# Patient Record
Sex: Male | Born: 1960 | Race: White | Hispanic: No | Marital: Single | Smoking: Never smoker
Health system: Southern US, Community
[De-identification: ages and names within clinical notes are randomized; demographics above are authoritative.]

## PROBLEM LIST (undated history)

## (undated) DIAGNOSIS — K5792 Diverticulitis of intestine, part unspecified, without perforation or abscess without bleeding: Secondary | ICD-10-CM

## (undated) DIAGNOSIS — Z789 Other specified health status: Secondary | ICD-10-CM

## (undated) DIAGNOSIS — I10 Essential (primary) hypertension: Secondary | ICD-10-CM

## (undated) HISTORY — DX: Diverticulitis of intestine, part unspecified, without perforation or abscess without bleeding: K57.92

## (undated) HISTORY — PX: NO PAST SURGERIES: SHX2092

---

## 2015-07-14 ENCOUNTER — Emergency Department: Payer: Self-pay

## 2015-07-14 ENCOUNTER — Inpatient Hospital Stay
Admission: EM | Admit: 2015-07-14 | Discharge: 2015-07-18 | DRG: 392 | Disposition: A | Discharge status: Home / Self Care | Payer: Self-pay | Attending: General Surgery | Admitting: General Surgery

## 2015-07-14 ENCOUNTER — Encounter: Payer: Self-pay | Admitting: Emergency Medicine

## 2015-07-14 DIAGNOSIS — K5792 Diverticulitis of intestine, part unspecified, without perforation or abscess without bleeding: Secondary | ICD-10-CM | POA: Diagnosis present

## 2015-07-14 DIAGNOSIS — K5720 Diverticulitis of large intestine with perforation and abscess without bleeding: Principal | ICD-10-CM | POA: Diagnosis present

## 2015-07-14 DIAGNOSIS — K5732 Diverticulitis of large intestine without perforation or abscess without bleeding: Secondary | ICD-10-CM | POA: Insufficient documentation

## 2015-07-14 DIAGNOSIS — K631 Perforation of intestine (nontraumatic): Secondary | ICD-10-CM

## 2015-07-14 DIAGNOSIS — Z79899 Other long term (current) drug therapy: Secondary | ICD-10-CM

## 2015-07-14 LAB — URINALYSIS COMPLETE WITH MICROSCOPIC (ARMC ONLY)
BILIRUBIN URINE: NEGATIVE
Bacteria, UA: NONE SEEN
GLUCOSE, UA: NEGATIVE mg/dL
Ketones, ur: NEGATIVE mg/dL
Nitrite: NEGATIVE
Protein, ur: 30 mg/dL — AB
SQUAMOUS EPITHELIAL / LPF: NONE SEEN
Specific Gravity, Urine: 1.025 (ref 1.005–1.030)
pH: 5 (ref 5.0–8.0)

## 2015-07-14 LAB — COMPREHENSIVE METABOLIC PANEL
ALK PHOS: 61 U/L (ref 38–126)
ALT: 39 U/L (ref 17–63)
AST: 22 U/L (ref 15–41)
Albumin: 3.7 g/dL (ref 3.5–5.0)
Anion gap: 7 (ref 5–15)
BUN: 15 mg/dL (ref 6–20)
CALCIUM: 9.2 mg/dL (ref 8.9–10.3)
CHLORIDE: 103 mmol/L (ref 101–111)
CO2: 28 mmol/L (ref 22–32)
CREATININE: 0.91 mg/dL (ref 0.61–1.24)
Glucose, Bld: 106 mg/dL — ABNORMAL HIGH (ref 65–99)
Potassium: 4.1 mmol/L (ref 3.5–5.1)
SODIUM: 138 mmol/L (ref 135–145)
Total Bilirubin: 0.9 mg/dL (ref 0.3–1.2)
Total Protein: 7.3 g/dL (ref 6.5–8.1)

## 2015-07-14 LAB — CBC
HCT: 44 % (ref 40.0–52.0)
Hemoglobin: 15.3 g/dL (ref 13.0–18.0)
MCH: 32.9 pg (ref 26.0–34.0)
MCHC: 34.9 g/dL (ref 32.0–36.0)
MCV: 94.2 fL (ref 80.0–100.0)
PLATELETS: 329 10*3/uL (ref 150–440)
RBC: 4.67 MIL/uL (ref 4.40–5.90)
RDW: 12.6 % (ref 11.5–14.5)
WBC: 16.1 10*3/uL — ABNORMAL HIGH (ref 3.8–10.6)

## 2015-07-14 LAB — LIPASE, BLOOD: LIPASE: 33 U/L (ref 22–51)

## 2015-07-14 MED ORDER — MORPHINE SULFATE 1 MG/ML IV SOLN
INTRAVENOUS | Status: DC
  Administered 2015-07-14: 23:00:00 via INTRAVENOUS
  Administered 2015-07-15: 3.8 mg via INTRAVENOUS
  Administered 2015-07-15 (×2): 3 mg via INTRAVENOUS
  Administered 2015-07-15: 1.5 mg via INTRAVENOUS
  Administered 2015-07-15: 19:00:00 via INTRAVENOUS
  Administered 2015-07-16: 1.5 mg via INTRAVENOUS
  Administered 2015-07-16: 0 mg via INTRAVENOUS
  Administered 2015-07-16 – 2015-07-17 (×4): 1.5 mg via INTRAVENOUS
  Filled 2015-07-14 (×2): qty 25

## 2015-07-14 MED ORDER — METRONIDAZOLE IN NACL 5-0.79 MG/ML-% IV SOLN
500.0000 mg | Freq: Once | INTRAVENOUS | Status: DC
  Administered 2015-07-14: 500 mg via INTRAVENOUS
  Filled 2015-07-14 (×2): qty 100

## 2015-07-14 MED ORDER — PIPERACILLIN-TAZOBACTAM 3.375 G IVPB
3.3750 g | Freq: Three times a day (TID) | INTRAVENOUS | Status: DC
  Administered 2015-07-14 – 2015-07-18 (×11): 3.375 g via INTRAVENOUS
  Filled 2015-07-14 (×14): qty 50

## 2015-07-14 MED ORDER — MORPHINE SULFATE (PF) 4 MG/ML IV SOLN
4.0000 mg | INTRAVENOUS | Status: DC | PRN
  Administered 2015-07-14 – 2015-07-17 (×2): 4 mg via INTRAVENOUS
  Filled 2015-07-14 (×2): qty 1

## 2015-07-14 MED ORDER — ENOXAPARIN SODIUM 40 MG/0.4ML ~~LOC~~ SOLN
40.0000 mg | SUBCUTANEOUS | Status: DC
  Administered 2015-07-14 – 2015-07-17 (×4): 40 mg via SUBCUTANEOUS
  Filled 2015-07-14 (×4): qty 0.4

## 2015-07-14 MED ORDER — IOHEXOL 240 MG/ML SOLN
25.0000 mL | Freq: Once | INTRAMUSCULAR | Status: AC | PRN
  Administered 2015-07-14: 25 mL via ORAL

## 2015-07-14 MED ORDER — IOHEXOL 300 MG/ML  SOLN
100.0000 mL | Freq: Once | INTRAMUSCULAR | Status: AC | PRN
  Administered 2015-07-14: 100 mL via INTRAVENOUS
  Filled 2015-07-14: qty 100

## 2015-07-14 MED ORDER — LACTATED RINGERS IV SOLN
INTRAVENOUS | Status: DC
  Administered 2015-07-14 – 2015-07-18 (×11): via INTRAVENOUS

## 2015-07-14 MED ORDER — DIPHENHYDRAMINE HCL 50 MG/ML IJ SOLN: 12.5000 mg | Freq: Four times a day (QID) | INTRAMUSCULAR | Status: DC | PRN

## 2015-07-14 MED ORDER — SODIUM CHLORIDE 0.9 % IV BOLUS (SEPSIS)
1000.0000 mL | Freq: Once | INTRAVENOUS | Status: AC
  Administered 2015-07-14: 1000 mL via INTRAVENOUS

## 2015-07-14 MED ORDER — LORAZEPAM 1 MG PO TABS: 1.0000 mg | ORAL_TABLET | Freq: Four times a day (QID) | ORAL | Status: AC | PRN

## 2015-07-14 MED ORDER — HYDROMORPHONE HCL 1 MG/ML IJ SOLN
0.5000 mg | INTRAMUSCULAR | Status: DC | PRN
  Administered 2015-07-14: 0.5 mg via INTRAVENOUS
  Filled 2015-07-14: qty 1

## 2015-07-14 MED ORDER — LORAZEPAM 2 MG/ML IJ SOLN: 1.0000 mg | Freq: Four times a day (QID) | INTRAMUSCULAR | Status: AC | PRN

## 2015-07-14 MED ORDER — FOLIC ACID 1 MG PO TABS
1.0000 mg | ORAL_TABLET | Freq: Every day | ORAL | Status: DC
  Administered 2015-07-14 – 2015-07-18 (×5): 1 mg via ORAL
  Filled 2015-07-14 (×5): qty 1

## 2015-07-14 MED ORDER — NALOXONE HCL 0.4 MG/ML IJ SOLN: 0.4000 mg | INTRAMUSCULAR | Status: DC | PRN

## 2015-07-14 MED ORDER — METOCLOPRAMIDE HCL 5 MG/ML IJ SOLN
5.0000 mg | Freq: Once | INTRAMUSCULAR | Status: AC
  Administered 2015-07-14: 5 mg via INTRAVENOUS
  Filled 2015-07-14: qty 2

## 2015-07-14 MED ORDER — ADULT MULTIVITAMIN W/MINERALS CH
1.0000 | ORAL_TABLET | Freq: Every day | ORAL | Status: DC
  Administered 2015-07-14 – 2015-07-18 (×5): 1 via ORAL
  Filled 2015-07-14 (×5): qty 1

## 2015-07-14 MED ORDER — CIPROFLOXACIN IN D5W 400 MG/200ML IV SOLN
400.0000 mg | Freq: Once | INTRAVENOUS | Status: AC
  Administered 2015-07-14: 400 mg via INTRAVENOUS
  Filled 2015-07-14 (×2): qty 200

## 2015-07-14 MED ORDER — THIAMINE HCL 100 MG/ML IJ SOLN
100.0000 mg | Freq: Every day | INTRAMUSCULAR | Status: DC
  Administered 2015-07-14: 100 mg via INTRAVENOUS
  Filled 2015-07-14: qty 2

## 2015-07-14 MED ORDER — ONDANSETRON 4 MG PO TBDP: 4.0000 mg | ORAL_TABLET | Freq: Four times a day (QID) | ORAL | Status: DC | PRN

## 2015-07-14 MED ORDER — ONDANSETRON HCL 4 MG/2ML IJ SOLN
4.0000 mg | Freq: Four times a day (QID) | INTRAMUSCULAR | Status: DC | PRN
  Administered 2015-07-15: 4 mg via INTRAVENOUS
  Filled 2015-07-14: qty 2

## 2015-07-14 MED ORDER — SODIUM CHLORIDE 0.9 % IJ SOLN: 9.0000 mL | INTRAMUSCULAR | Status: DC | PRN

## 2015-07-14 MED ORDER — DIPHENHYDRAMINE HCL 50 MG/ML IJ SOLN: 25.0000 mg | Freq: Four times a day (QID) | INTRAMUSCULAR | Status: DC | PRN

## 2015-07-14 MED ORDER — DIPHENHYDRAMINE HCL 12.5 MG/5ML PO ELIX: 12.5000 mg | ORAL_SOLUTION | Freq: Four times a day (QID) | ORAL | Status: DC | PRN

## 2015-07-14 MED ORDER — VITAMIN B-1 100 MG PO TABS
100.0000 mg | ORAL_TABLET | Freq: Every day | ORAL | Status: DC
  Administered 2015-07-15 – 2015-07-18 (×4): 100 mg via ORAL
  Filled 2015-07-14 (×5): qty 1

## 2015-07-14 MED ORDER — ONDANSETRON HCL 4 MG/2ML IJ SOLN: 4.0000 mg | Freq: Four times a day (QID) | INTRAMUSCULAR | Status: DC | PRN

## 2015-07-14 MED ORDER — HYDRALAZINE HCL 20 MG/ML IJ SOLN: 10.0000 mg | INTRAMUSCULAR | Status: DC | PRN

## 2015-07-14 MED ORDER — DIPHENHYDRAMINE HCL 25 MG PO CAPS: 25.0000 mg | ORAL_CAPSULE | Freq: Four times a day (QID) | ORAL | Status: DC | PRN

## 2015-07-14 NOTE — ED Notes (Signed)
Pt to ed with c/o abd pain and n/v/d since Thursday.  Sent from Dr. Vear Clock office for EVAL.

## 2015-07-14 NOTE — ED Notes (Signed)
Pt states that it burns when he urinates, denies seeing blood in urine. Pt reports hesitancy. Denies urgency or frequency.

## 2015-07-14 NOTE — ED Provider Notes (Signed)
[facility] Emergency Department Provider Note  ____________________________________________  Time seen: 1710  I have reviewed the triage vital signs and the nursing notes.   HISTORY  Chief Complaint Abdominal Pain     HPI Karl Richardson is a 54 y.o. male who began to have abdominal pain this past Thursday. He went up lying in bed feeling ill, feeling cold, shivering. He did not take his temperature. He started to have diarrhea on Saturday. He denies any nausea or vomiting.  He denies any previous similar symptoms.  His pain has continued. He reports primarily in his lower abdomen, equal on both sides. It does hurt more when he urinates as well as when he defecates.  He denies any blood per rectum.  He went to see his primary physician, Dr. Vear Clock, earlier today and was sent to the emergency department for further evaluation.       History reviewed. No pertinent past medical history.  There are no active problems to display for this patient.   History reviewed. No pertinent past surgical history.  No current outpatient prescriptions on file.  Allergies Review of patient's allergies indicates no known allergies.  History reviewed. No pertinent family history.  Social History Social History  Substance Use Topics  . Smoking status: Never Smoker   . Smokeless tobacco: None  . Alcohol Use: Yes    Review of Systems  Constitutional: Negative for fever. ENT: Negative for sore throat. Cardiovascular: Negative for chest pain. Respiratory: Negative for shortness of breath. Gastrointestinal: Positive for abdominal pain with diarrhea. No nausea or vomiting. Genitourinary: Negative for dysuria. Musculoskeletal: No myalgias or injuries. Skin: Negative for rash. Neurological: Negative for headaches   10-point ROS otherwise negative.  ____________________________________________   PHYSICAL EXAM:  VITAL SIGNS: ED Triage Vitals  Enc Vitals  Group     BP 07/14/15 1457 150/82 mmHg     Pulse Rate 07/14/15 1457 83     Resp 07/14/15 1457 20     Temp 07/14/15 1457 98.2 F (36.8 C)     Temp Source 07/14/15 1457 Oral     SpO2 07/14/15 1457 100 %     Weight 07/14/15 1457 145 lb (65.772 kg)     Height 07/14/15 1457  (1.651 m)     Head Cir --      Peak Flow --      Pain Score 07/14/15 1458 6     Pain Loc --      Pain Edu? --      Excl. in GC? --     Constitutional:  Alert and oriented. Looks slightly uncomfortable but in no distress. ENT   Head: Normocephalic and atraumatic.   Nose: No congestion/rhinnorhea. Cardiovascular: Normal rate, regular rhythm, no murmur noted Respiratory:  Normal respiratory effort, no tachypnea.    Breath sounds are clear and equal bilaterally.  Gastrointestinal: Soft. Notable tenderness diffusely, but appears to be worse in the lower abdomen, equal bilaterally. No distention.  Back: No muscle spasm, no tenderness, no CVA tenderness. Musculoskeletal: No deformity noted. Nontender with normal range of motion in all extremities.  No noted edema. Neurologic:  Normal speech and language. No gross focal neurologic deficits are appreciated.  Skin:  Skin is warm, dry. No rash noted. Psychiatric: Mood and affect are normal. Speech and behavior are normal.  ____________________________________________    LABS (pertinent positives/negatives)  Labs Reviewed  COMPREHENSIVE METABOLIC PANEL - Abnormal; Notable for the following:    Glucose, Bld 106 (*)  All other components within normal limits  CBC - Abnormal; Notable for the following:    WBC 16.1 (*)    All other components within normal limits  URINALYSIS COMPLETEWITH MICROSCOPIC ([facility] ONLY) - Abnormal; Notable for the following:    Color, Urine AMBER (*)    APPearance CLEAR (*)    Hgb urine dipstick 1+ (*)    Protein, ur 30 (*)    Leukocytes, UA TRACE (*)    All other components within normal limits  LIPASE, BLOOD    RADIOLOGY  CT abdomen:  Sigmoid diverticulitis with evidence of perforation without abscess. The patient has some free air outside the lumen.  ____________________________________________   INITIAL IMPRESSION / ASSESSMENT AND PLAN / ED COURSE  Pertinent labs & imaging results that were available during my care of the patient were reviewed by me and considered in my medical decision making (see chart for details).  54 year old male with atypical lower abdominal pain, febrile symptoms, and diarrhea. He could have cellulitis, diverticulitis, or some other infectious process. I discussed getting a CT or not with the patient. He could be treated empirically with antibiotics. The patient and I agree to obtaining the CT for further information and clarification.  ----------------------------------------- 6:47 PM on 07/14/2015 -----------------------------------------  I proceeded focal from radiology with the interpretation from CT. The patient has sigmoid diverticulitis with a perforation without abscess. I have called surgery and spoke with Dr. Juliann Pulse who will pass it onto his colleague to evaluate the patient in the emergency department.  ____________________________________________   FINAL CLINICAL IMPRESSION(S) / ED DIAGNOSES  Final diagnoses:  Sigmoid diverticulitis  Perforation of sigmoid colon (HCC)       Darien Ramus, MD 07/14/15 [phone]

## 2015-07-14 NOTE — H&P (Signed)
Patient ID: Karl Richardson, male   DOB: 1961/09/10, 54 y.o.   MRN: [id number] CC: ABD PAIN HPI Karl Richardson is a 54 y.o. male who presents to the emergency department for evaluation of a four-day history of lower abdominal pain. Patient states that the pain started just below the umbilicus and moved to primarily his left lower quadrant over the last 4 days. Starting today the pain also started rate over to the right lower quadrant. He's had subjective fever and chills however none since Friday night. He states that the pain was actually at its worst Friday night and gradually improved over the weekend. He came in today because the pain moved to a new area and had not completely resolved and he thought he should be checked out. He states the pain is worsened when he attempts to void either urine or stool. He has had some nausea however been able to eat throughout this. He's never had anything like this. Prior to Thursday he was in his usual state of good health. He states his last bowel movement was earlier today but was running. He has been able to void in the emergency department for the laboratory evaluation. He denies any current fevers, chills, shortness breath, chest pain or nausea.  HPI  History reviewed. No pertinent past medical history. takes no medications  History reviewed. No pertinent past surgical history. has never had surgery  History reviewed. No pertinent family history. patient and mother deny any significant family medical history.  Social History Social History  Substance Use Topics  . Smoking status: Never Smoker   . Smokeless tobacco: None  . 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 He should also states that he smokes 2-3 joints of marijuana every day.  No Known Allergies  Current Facility-Administered Medications  Medication Dose Route Frequency Provider Last Rate Last Dose  . ciprofloxacin (CIPRO) IVPB 400 mg  400 mg Intravenous Once Darien Ramus, MD 200 mL/hr at 07/14/15 1926 400 mg at 07/14/15 1926  . HYDROmorphone (DILAUDID) injection 0.5 mg  0.5 mg Intravenous Q15 min PRN Darien Ramus, MD   0.5 mg at 07/14/15 1903  . metroNIDAZOLE (FLAGYL) IVPB 500 mg  500 mg Intravenous Once Darien Ramus, MD       No current outpatient prescriptions on file.     Review of Systems A multipoint review of systems was completed. All pertinent positives negatives within the history of present illness.  Physical Exam Blood pressure 150/82, pulse 83, temperature 98.2 F (36.8 C), temperature source Oral, resp. rate 20, height  (1.651 m), weight 65.772 kg (145 lb), SpO2 100 %. CONSTITUTIONAL: Resting in bed in no acute distress. EYES: Pupils are equal, round, and reactive to light, Sclera are non-icteric. EARS, NOSE, MOUTH AND THROAT: The oropharynx is clear. The oral mucosa is pink and moist. Hearing is intact to voice. LYMPH NODES:  Lymph nodes in the neck are normal. RESPIRATORY:  Lungs are clear. There is normal respiratory effort, with equal breath sounds bilaterally, and without pathologic use of accessory muscles. CARDIOVASCULAR: Heart is regular without murmurs, gallops, or rubs. GI: The abdomen is soft, tender to palpation in the left and right lower quadrant. The left lower quadrant is the worst. Nondistended. There are no palpable masses. There is no hepatosplenomegaly. There are normal bowel sounds in all quadrants. GU: Rectal deferred.   MUSCULOSKELETAL: Normal muscle strength and tone. No cyanosis or edema.   SKIN: Turgor  is good and there are no pathologic skin lesions or ulcers. NEUROLOGIC: Motor and sensation is grossly normal. Cranial nerves are grossly intact. PSYCH:  Oriented to person, place and time. Affect is normal.  Data Reviewed Labs and images reviewed by me. Labs are significant for a leukocytosis of 16,000. His imaging shows severe inflammation around the sigmoid colon with thickening of the sigmoid  colon. This appears to be ruptured diverticulitis however might represent a perforated colon cancer. There is no obvious abscess or large amounts of free intraperitoneal air. I have personally reviewed the patient's imaging, laboratory findings and medical records.    Assessment    54 year old male with ruptured diverticulitis.    Plan    Discussed with the patient and his mother the treatment for ruptured diverticulitis to include nothing by mouth, IV fluids, IV antibiotics. Discussed once his pain has resolved to within start him on oral antibiotics with a plan for colonoscopy in 6 weeks. Also discussed that should his pain not improve that he may require a repeat CT scan in a few days followed by likely surgical intervention. Patient and his mother voiced understanding of this plan. Discussed with the patient that although he has never withdrawn for alcohol he admits to having never stops drinking before. We'll place him on alcohol withdrawal protocol to get him through the next few days. Once he is able to resume a diet we will quickly add alcohol back to it.     Time spent with the patient was 30 minutes, with more than 50% of the time spent in face-to-face education, counseling and care coordination.     [name] 07/14/2015, 7:41 PM

## 2015-07-15 DIAGNOSIS — K5720 Diverticulitis of large intestine with perforation and abscess without bleeding: Principal | ICD-10-CM

## 2015-07-15 LAB — CBC
HEMATOCRIT: 39 % — AB (ref 40.0–52.0)
HEMOGLOBIN: 13.4 g/dL (ref 13.0–18.0)
MCH: 32.7 pg (ref 26.0–34.0)
MCHC: 34.4 g/dL (ref 32.0–36.0)
MCV: 95.1 fL (ref 80.0–100.0)
Platelets: 285 10*3/uL (ref 150–440)
RBC: 4.1 MIL/uL — ABNORMAL LOW (ref 4.40–5.90)
RDW: 12.3 % (ref 11.5–14.5)
WBC: 16.7 10*3/uL — AB (ref 3.8–10.6)

## 2015-07-15 LAB — PHOSPHORUS: PHOSPHORUS: 3.3 mg/dL (ref 2.5–4.6)

## 2015-07-15 LAB — BASIC METABOLIC PANEL
ANION GAP: 8 (ref 5–15)
BUN: 13 mg/dL (ref 6–20)
CALCIUM: 8.5 mg/dL — AB (ref 8.9–10.3)
CHLORIDE: 103 mmol/L (ref 101–111)
CO2: 26 mmol/L (ref 22–32)
Creatinine, Ser: 0.79 mg/dL (ref 0.61–1.24)
GFR calc non Af Amer: >60 mL/min (ref >60)
Glucose, Bld: 111 mg/dL — ABNORMAL HIGH (ref 65–99)
Potassium: 3.8 mmol/L (ref 3.5–5.1)
Sodium: 137 mmol/L (ref 135–145)

## 2015-07-15 LAB — MAGNESIUM: Magnesium: 1.8 mg/dL (ref 1.7–2.4)

## 2015-07-15 NOTE — Progress Notes (Signed)
Spoke with Dr. Juliann Pulse to see if patient may have ice chips.  Order received.

## 2015-07-15 NOTE — Progress Notes (Signed)
Surgery Progress Note  S: Mild improvement in pain O:Blood pressure 133/74, pulse 87, temperature 99.5 F (37.5 C), temperature source Oral, resp. rate 16, height  (1.651 m), weight 145 lb (65.772 kg), SpO2 96 %. GEN: NAD/A&Ox3 ABD: soft, mild tender suprapubic, nondistended  WBC 16.7  A/P 54 yo admit with diverticulitis - NPO - IV abx

## 2015-07-15 NOTE — Progress Notes (Signed)
Spoke with Dr. Juliann Pulse to clarify NPO order as patient has PO medications ordered.  Changed to NPO except meds.

## 2015-07-16 LAB — BASIC METABOLIC PANEL
ANION GAP: 10 (ref 5–15)
BUN: 10 mg/dL (ref 6–20)
CALCIUM: 8.5 mg/dL — AB (ref 8.9–10.3)
CHLORIDE: 97 mmol/L — AB (ref 101–111)
CO2: 26 mmol/L (ref 22–32)
Creatinine, Ser: 0.74 mg/dL (ref 0.61–1.24)
GFR calc non Af Amer: >60 mL/min (ref >60)
Glucose, Bld: 103 mg/dL — ABNORMAL HIGH (ref 65–99)
POTASSIUM: 3.9 mmol/L (ref 3.5–5.1)
Sodium: 133 mmol/L — ABNORMAL LOW (ref 135–145)

## 2015-07-16 LAB — CBC
HEMATOCRIT: 39.7 % — AB (ref 40.0–52.0)
HEMOGLOBIN: 13.5 g/dL (ref 13.0–18.0)
MCH: 31.9 pg (ref 26.0–34.0)
MCHC: 34.1 g/dL (ref 32.0–36.0)
MCV: 93.5 fL (ref 80.0–100.0)
Platelets: 312 10*3/uL (ref 150–440)
RBC: 4.24 MIL/uL — AB (ref 4.40–5.90)
RDW: 12.1 % (ref 11.5–14.5)
WBC: 21.9 10*3/uL — AB (ref 3.8–10.6)

## 2015-07-16 LAB — MAGNESIUM: Magnesium: 1.7 mg/dL (ref 1.7–2.4)

## 2015-07-16 LAB — PHOSPHORUS: PHOSPHORUS: 3.4 mg/dL (ref 2.5–4.6)

## 2015-07-16 NOTE — Progress Notes (Signed)
Surgery Progress Note  S: Feels better, min pain. Hungry O:Blood pressure 135/74, pulse 86, temperature 100 F (37.8 C), temperature source Oral, resp. rate 15, height  (1.651 m), weight 145 lb (65.772 kg), SpO2 99 %. GEN: NAD/A&Ox3 ABD: soft, min tender, nondistended  WBC 21  A/P 54 yo admit with diverticulitis, improving - IV abx - liquids

## 2015-07-17 LAB — CBC
HEMATOCRIT: 40.5 % (ref 40.0–52.0)
HEMOGLOBIN: 13.7 g/dL (ref 13.0–18.0)
MCH: 31.8 pg (ref 26.0–34.0)
MCHC: 33.9 g/dL (ref 32.0–36.0)
MCV: 93.8 fL (ref 80.0–100.0)
Platelets: 338 10*3/uL (ref 150–440)
RBC: 4.32 MIL/uL — ABNORMAL LOW (ref 4.40–5.90)
RDW: 12.2 % (ref 11.5–14.5)
WBC: 19.4 10*3/uL — AB (ref 3.8–10.6)

## 2015-07-17 LAB — BASIC METABOLIC PANEL
ANION GAP: 8 (ref 5–15)
BUN: 8 mg/dL (ref 6–20)
CALCIUM: 8.6 mg/dL — AB (ref 8.9–10.3)
CHLORIDE: 100 mmol/L — AB (ref 101–111)
CO2: 28 mmol/L (ref 22–32)
Creatinine, Ser: 0.82 mg/dL (ref 0.61–1.24)
GFR calc non Af Amer: >60 mL/min (ref >60)
GLUCOSE: 115 mg/dL — AB (ref 65–99)
Potassium: 4 mmol/L (ref 3.5–5.1)
Sodium: 136 mmol/L (ref 135–145)

## 2015-07-17 LAB — PHOSPHORUS: PHOSPHORUS: 3.1 mg/dL (ref 2.5–4.6)

## 2015-07-17 LAB — MAGNESIUM: Magnesium: 2 mg/dL (ref 1.7–2.4)

## 2015-07-17 NOTE — Progress Notes (Signed)
Surgery Progress Note  S: Feels better, hungry O:Blood pressure 133/74, pulse 76, temperature 98.3 F (36.8 C), temperature source Oral, resp. rate 17, height  (1.651 m), weight 145 lb (65.772 kg), SpO2 97 %. GEN: NAD/A&Ox3 ABD: soft, min tender, nondistended  A/P 54 yo admit with diverticulitis, improving - regular diet - d/c pca - possibly transition to PO abx later today

## 2015-07-18 MED ORDER — CIPROFLOXACIN HCL 500 MG PO TABS
500.0000 mg | ORAL_TABLET | Freq: Two times a day (BID) | ORAL | Status: DC
  Administered 2015-07-18: 500 mg via ORAL
  Filled 2015-07-18: qty 1

## 2015-07-18 MED ORDER — METRONIDAZOLE 500 MG PO TABS
500.0000 mg | ORAL_TABLET | Freq: Three times a day (TID) | ORAL | Status: DC
  Administered 2015-07-18: 500 mg via ORAL
  Filled 2015-07-18: qty 1

## 2015-07-18 MED ORDER — CIPROFLOXACIN HCL 500 MG PO TABS: 500.0000 mg | ORAL_TABLET | Freq: Two times a day (BID) | ORAL | Status: DC

## 2015-07-18 MED ORDER — METRONIDAZOLE 500 MG PO TABS: 500.0000 mg | ORAL_TABLET | Freq: Three times a day (TID) | ORAL | Status: DC

## 2015-07-18 NOTE — Progress Notes (Signed)
Surgery Progress Note  S: Min pain (soreness), tolerating regular diet O: Blood pressure 137/71, pulse 67, temperature 98.5 F (36.9 C), temperature source Oral, resp. rate 16, height  (1.651 m), weight 145 lb (65.772 kg), SpO2 100 %. GEN: NAD/A&Ox3 ABD: soft, min tender, nondistended  A/P 54 yo admit with diverticulitis, clinically improved - regular diet - transition to PO abx

## 2015-07-18 NOTE — Progress Notes (Signed)
Dr. Juliann Pulse rounded on pt. Dr. Juliann Pulse gave orders to primary nurse to continue with discharge. Pt was instructed to contact Dr. Juliann Pulse office or come back to the ER if tingling increases.

## 2015-07-18 NOTE — Discharge Instructions (Signed)
Diverticulitis  Diverticulitis is when small pockets that have formed in your colon (large intestine) become infected or swollen.  HOME CARE  · Follow your doctor's instructions.  · Follow a special diet if told by your doctor.  · When you feel better, your doctor may tell you to change your diet. You may be told to eat a lot of fiber. Fruits and vegetables are good sources of fiber. Fiber makes it easier to poop (have bowel movements).  · Take supplements or probiotics as told by your doctor.  · Only take medicines as told by your doctor.  · Keep all follow-up visits with your doctor.  GET HELP IF:  · Your pain does not get better.  · You have a hard time eating food.  · You are not pooping like normal.  GET HELP RIGHT AWAY IF:  · Your pain gets worse.  · Your problems do not get better.  · Your problems suddenly get worse.  · You have a fever.  · You keep throwing up (vomiting).  · You have bloody or black, tarry poop (stool).  MAKE SURE YOU:   · Understand these instructions.  · Will watch your condition.  · Will get help right away if you are not doing well or get worse.     This information is not intended to replace advice given to you by your health care provider. Make sure you discuss any questions you have with your health care provider.     Document Released: 03/15/2008 Document Revised: 10/02/2013 Document Reviewed: 08/22/2013  Elsevier Interactive Patient Education ©2016 [employer] Inc[employer]

## 2015-07-18 NOTE — Progress Notes (Signed)
Pt received discharge orders. Instructions were reviewed with pt. IV removed with dressing dry and intact. Pt complained several minutes later of tingling in left hand. VSS taken and stable. Hand Grips Strong. Dr. Juliann Pulse notified and verbalized that he would come see pt. Primary nurse to continue to monitor pt.

## 2015-07-23 NOTE — Discharge Summary (Signed)
Physician Discharge Summary  Patient ID: Chauncey MannJoseph Sherrill MRN: [id number] DOB/AGE: 10-13-1960 54 y.o.  Admit date: 07/14/2015 Discharge date: 07/23/2015  Admission Diagnoses:  Discharge Diagnoses:  Active Problems:   Diverticulitis   Sigmoid diverticulitis   Discharged Condition: good  Hospital Course: Mr. Monia PouchRiggs was admitted and made NPO.  He was given IVF and IV antibiotics.  As his pain resolved, his diet was advanced and he was transitioned to PO antibiotics.  At time of discharge he was tolerating a diet and antibiotics.  Consults: None  Significant Diagnostic Studies: radiology: CT scan: Severe diverticulitis  Treatments: antibiotics: Zosyn  Discharge Exam: Blood pressure 141/72, pulse 64, temperature 98.2 F (36.8 C), temperature source Oral, resp. rate 18, height 5\' 5"  (1.651 m), weight 145 lb (65.772 kg), SpO2 100 %. GEN: NAD/A&Ox3 ABD; soft, min tender, nondistended  Disposition: 01-Home or Self Care     Medication List    TAKE these medications        ciprofloxacin 500 MG tablet  Commonly known as:  CIPRO  Take 1 tablet (500 mg total) by mouth 2 (two) times daily.     metroNIDAZOLE 500 MG tablet  Commonly known as:  FLAGYL  Take 1 tablet (500 mg total) by mouth every 8 (eight) hours.           Follow-up Information    Follow up with Ida Roguehristopher Yacine Garriga, MD. Go on 07/24/2015.   Specialty:  Surgery   Why:  Thursday, Oct. 13 at 1:45 p.m.   Contact information:   [address] [place] [id number] [phone]       Signed: Ida RogueChristopher Tayshun Gappa 07/23/2015, 12:59 PM

## 2015-07-24 ENCOUNTER — Other Ambulatory Visit: Payer: Self-pay

## 2015-07-24 ENCOUNTER — Encounter: Payer: Self-pay | Admitting: *Deleted

## 2015-07-24 ENCOUNTER — Ambulatory Visit (INDEPENDENT_AMBULATORY_CARE_PROVIDER_SITE_OTHER): Payer: Self-pay | Admitting: General Surgery

## 2015-07-24 VITALS — BP 138/84 | HR 68 | Temp 98.0°F | Ht 66.0 in | Wt 142.0 lb

## 2015-07-24 DIAGNOSIS — K5732 Diverticulitis of large intestine without perforation or abscess without bleeding: Secondary | ICD-10-CM

## 2015-07-24 NOTE — Progress Notes (Signed)
Outpatient Surgical Follow Up  07/24/2015  Karl Richardson is an 54 y.o. male.   Chief Complaint  Patient presents with  . Diverticulitis    hospital follow up    HPI: 54 year old male following up for diverticulitis. He was recently admitted for his first ever bout of diverticulitis. He has never had a colonoscopy. He states that the abdominal pain that he had that brought him to the operative room is completely gone however he still taking antibiotics. He has been tolerating regular diet and having normal bowel function. He denies any fevers, chills, nausea, vomiting, diarrhea, constipation. Doing well.  Past Medical History  Diagnosis Date  . Diverticulitis     History reviewed. No pertinent past surgical history.  Family History  Problem Relation Age of Onset  . Hypertension Father   . Cancer Father     Social History:  reports that he has never smoked. He has never used smokeless tobacco. He reports that he drinks alcohol. He reports that he uses illicit drugs (Marijuana).  Allergies: No Known Allergies  Medications reviewed.    ROS A multisystem review of systems was completed. All pertinent positives negatives in the history of present illness remainder were negative.   BP 138/84 mmHg  Pulse 68  Temp(Src) 98 F (36.7 C) (Oral)  Ht 5\' 6"  (1.676 m)  Wt 64.411 kg (142 lb)  BMI 22.93 kg/m2  Physical Exam Gen.: No acute distress Chest: Her to auscultation Heart: Regular rate and rhythm Abdomen: Soft, nontender, nondistended.    No results found for this or any previous visit (from the past 48 hour(s)). No results found.  Assessment/Plan:  1. Sigmoid diverticulitis 54 year old male currently being treated with antibiotics for sigmoid diverticulitis. Continues to improve. Discussed it with the patient that his complete his antibiotics as prescribed and plan for an outpatient colonoscopy in about 6 weeks. We will see back in clinic in 2-4 weeks to schedule for  colonoscopy. Counseled the patient that should his symptoms return he is to let us know immediately and to report to the emergency department.   Karl Richardson  07/24/2015,negative

## 2015-07-24 NOTE — Patient Instructions (Signed)
Follow-up 2-4 weeks with Dr. Servando SnareWohl to discuss getting a Colonoscopy.  Please see your Disability note provided.

## 2015-08-25 ENCOUNTER — Encounter: Payer: Self-pay | Admitting: *Deleted

## 2015-08-27 NOTE — Discharge Instructions (Signed)

## 2015-08-28 ENCOUNTER — Ambulatory Visit
Admission: RE | Admit: 2015-08-28 | Discharge: 2015-08-28 | Disposition: A | Discharge status: Home / Self Care | Payer: Self-pay | Source: Ambulatory Visit | Attending: Gastroenterology | Admitting: Gastroenterology

## 2015-08-28 ENCOUNTER — Ambulatory Visit: Payer: Self-pay | Admitting: Anesthesiology

## 2015-08-28 ENCOUNTER — Ambulatory Visit: Payer: MEDICAID | Admitting: Anesthesiology

## 2015-08-28 ENCOUNTER — Encounter
Admission: RE | Disposition: A | Discharge status: Home / Self Care | Payer: Self-pay | Source: Ambulatory Visit | Attending: Gastroenterology

## 2015-08-28 DIAGNOSIS — Z8249 Family history of ischemic heart disease and other diseases of the circulatory system: Secondary | ICD-10-CM | POA: Insufficient documentation

## 2015-08-28 DIAGNOSIS — K641 Second degree hemorrhoids: Secondary | ICD-10-CM

## 2015-08-28 DIAGNOSIS — K5730 Diverticulosis of large intestine without perforation or abscess without bleeding: Secondary | ICD-10-CM

## 2015-08-28 DIAGNOSIS — Z809 Family history of malignant neoplasm, unspecified: Secondary | ICD-10-CM | POA: Insufficient documentation

## 2015-08-28 DIAGNOSIS — K5732 Diverticulitis of large intestine without perforation or abscess without bleeding: Secondary | ICD-10-CM

## 2015-08-28 DIAGNOSIS — Z79899 Other long term (current) drug therapy: Secondary | ICD-10-CM | POA: Insufficient documentation

## 2015-08-28 HISTORY — PX: COLONOSCOPY WITH PROPOFOL: SHX5780

## 2015-08-28 HISTORY — DX: Other specified health status: Z78.9

## 2015-08-28 SURGERY — COLONOSCOPY WITH PROPOFOL
Anesthesia: Monitor Anesthesia Care | Wound class: Contaminated

## 2015-08-28 MED ORDER — STERILE WATER FOR IRRIGATION IR SOLN
Status: DC | PRN
  Administered 2015-08-28: 09:00:00

## 2015-08-28 MED ORDER — ACETAMINOPHEN 160 MG/5ML PO SOLN: 325.0000 mg | ORAL | Status: DC | PRN

## 2015-08-28 MED ORDER — PROPOFOL 10 MG/ML IV BOLUS
INTRAVENOUS | Status: DC | PRN
  Administered 2015-08-28: 50 mg via INTRAVENOUS
  Administered 2015-08-28: 30 mg via INTRAVENOUS
  Administered 2015-08-28: 100 mg via INTRAVENOUS
  Administered 2015-08-28: 20 mg via INTRAVENOUS

## 2015-08-28 MED ORDER — LACTATED RINGERS IV SOLN
INTRAVENOUS | Status: DC
  Administered 2015-08-28 (×2): via INTRAVENOUS

## 2015-08-28 MED ORDER — ACETAMINOPHEN 325 MG PO TABS: 325.0000 mg | ORAL_TABLET | ORAL | Status: DC | PRN

## 2015-08-28 MED ORDER — LIDOCAINE HCL (CARDIAC) 20 MG/ML IV SOLN
INTRAVENOUS | Status: DC | PRN
  Administered 2015-08-28: 20 mg via INTRAVENOUS

## 2015-08-28 SURGICAL SUPPLY — 28 items

## 2015-08-28 NOTE — Op Note (Signed)
[facility] Patient Name: Karl Richardson Procedure Date: 08/28/2015 8:24 AM MRN: [id number] Account #: [id number] Date of Birth: January 08, 1961 Admit Type: Outpatient Age: 54 Room: [facility] OR ROOM 01 Gender: Male Note Status: Finalized Procedure:         Colonoscopy Indications:       Follow-up of diverticulitis Providers:         Karl Minium, MD Referring MD:      Raliegh Ip, MD (Referring MD) Medicines:         Propofol per Anesthesia Complications:     No immediate complications. Procedure:         Pre-Anesthesia Assessment:                    - Prior to the procedure, a History and Physical was                     performed, and patient medications and allergies were                     reviewed. The patient's tolerance of previous anesthesia                     was also reviewed. The risks and benefits of the procedure                     and the sedation options and risks were discussed with the                     patient. All questions were answered, and informed consent                     was obtained. Prior Anticoagulants: The patient has taken                     no previous anticoagulant or antiplatelet agents. ASA                     Grade Assessment: II - A patient with mild systemic                     disease. After reviewing the risks and benefits, the                     patient was deemed in satisfactory condition to undergo                     the procedure.                    After obtaining informed consent, the colonoscope was                     passed under direct vision. Throughout the procedure, the                     patient's blood pressure, pulse, and oxygen saturations                     were monitored continuously. The Olympus CF H180AL                     colonoscope (S#: [id number]) was introduced through the anus  and advanced to the the cecum, identified by appendiceal   orifice and ileocecal valve. The colonoscopy was performed                     without difficulty. The patient tolerated the procedure                     well. The quality of the bowel preparation was excellent. Findings:      The perianal and digital rectal examinations were normal.      Multiple small-mouthed diverticula were found in the sigmoid colon.      Non-bleeding internal hemorrhoids were found during retroflexion. The       hemorrhoids were Grade II (internal hemorrhoids that prolapse but reduce       spontaneously). Impression:        - Diverticulosis in the sigmoid colon.                    - Non-bleeding internal hemorrhoids.                    - No specimens collected. Recommendation:    - Repeat colonoscopy in 10 years for screening purposes. Procedure Code(s): --- Professional ---                    [id number], Colonoscopy, flexible; diagnostic, including                     collection of specimen(s) by brushing or washing, when                     performed (separate procedure) Diagnosis Code(s): --- Professional ---                    U04.54K57.32, Diverticulitis of large intestine without                     perforation or abscess without bleeding                    K57.30, Diverticulosis of large intestine without                     perforation or abscess without bleeding                    K64.1, Second degree hemorrhoids CPT copyright 2014 American Medical Association. All rights reserved. The codes documented in this report are preliminary and upon coder review may  be revised to meet current compliance requirements. Karl Miniumarren Caelin Rosen, MD 08/28/2015 8:42:49 AM This report has been signed electronically. Number of Addenda: 0 Note Initiated On: 08/28/2015 8:24 AM Scope Withdrawal Time: 0 hours 6 minutes 45 seconds  Total Procedure Duration: 0 hours 8 minutes 48 seconds       [facility]

## 2015-08-28 NOTE — Anesthesia Preprocedure Evaluation (Signed)
Anesthesia Evaluation  Patient identified by MRN, date of birth, ID band  Reviewed: Allergy & Precautions, H&P , NPO status , Patient's Chart, lab work & pertinent test results  Airway Mallampati: II  TM Distance: >3 FB Neck ROM: full    Dental no notable dental hx.    Pulmonary    Pulmonary exam normal       Cardiovascular Rhythm:regular Rate:Normal     Neuro/Psych    GI/Hepatic   Endo/Other    Renal/GU      Musculoskeletal   Abdominal   Peds  Hematology   Anesthesia Other Findings   Reproductive/Obstetrics                             Anesthesia Physical Anesthesia Plan  ASA: II  Anesthesia Plan: MAC   Post-op Pain Management:    Induction:   Airway Management Planned:   Additional Equipment:   Intra-op Plan:   Post-operative Plan:   Informed Consent: I have reviewed the patients History and Physical, chart, labs and discussed the procedure including the risks, benefits and alternatives for the proposed anesthesia with the patient or authorized representative who has indicated his/her understanding and acceptance.     Plan Discussed with: CRNA  Anesthesia Plan Comments:         Anesthesia Quick Evaluation  

## 2015-08-28 NOTE — H&P (Signed)
  [facility]  [address] [place], [place] [address] Phone: [phone] Fax : [phone]  Primary Care Physician:  Marcine MatarPHILLIPS JR, CHARLES W, MD Primary Gastroenterologist:  Dr. Servando SnareWohl  Pre-Procedure History & Physical: HPI:  Karl Richardson is a 54 y.o. male is here for an colonoscopy.   Past Medical History  Diagnosis Date  . Diverticulitis   . Medical history non-contributory     Past Surgical History  Procedure Laterality Date  . No past surgeries      Prior to Admission medications   Medication Sig Start Date End Date Taking? Authorizing Provider  ciprofloxacin (CIPRO) 500 MG tablet Take 1 tablet (500 mg total) by mouth 2 (two) times daily. 07/18/15   Ida Roguehristopher Lundquist, MD  metroNIDAZOLE (FLAGYL) 500 MG tablet Take 1 tablet (500 mg total) by mouth every 8 (eight) hours. 07/18/15   Ida Roguehristopher Lundquist, MD    Allergies as of 07/24/2015  . (No Known Allergies)    Family History  Problem Relation Age of Onset  . Hypertension Father   . Cancer Father     Social History   Social History  . Marital Status: Single    Spouse Name: N/A  . Number of Children: N/A  . Years of Education: N/A   Occupational History  . Not on file.   Social History Main Topics  . Smoking status: Never Smoker   . Smokeless tobacco: Never Used  . Alcohol Use: 25.2 oz/week    42 Cans of beer per week  . Drug Use: Yes    Special: Marijuana  . Sexual Activity: Not on file   Other Topics Concern  . Not on file   Social History Narrative    Review of Systems: See HPI, otherwise negative ROS  Physical Exam: BP 129/90 mmHg  Pulse 67  Temp(Src) 98.1 F (36.7 C) (Temporal)  Resp 16  Ht 5\' 6"  (1.676 m)  Wt 138 lb (62.596 kg)  BMI 22.28 kg/m2  SpO2 100% General:   Alert,  pleasant and cooperative in NAD Head:  Normocephalic and atraumatic. Neck:  Supple; no masses or thyromegaly. Lungs:  Clear throughout to auscultation.    Heart:  Regular rate and  rhythm. Abdomen:  Soft, nontender and nondistended. Normal bowel sounds, without guarding, and without rebound.   Neurologic:  Alert and  oriented x4;  grossly normal neurologically.  Impression/Plan: Karl MannJoseph Richardson is here for an colonoscopy to be performed for diverticulitis  Risks, benefits, limitations, and alternatives regarding  colonoscopy have been reviewed with the patient.  Questions have been answered.  All parties agreeable.   Darlina Rumpfaren Saraphina Lauderbaugh, MD  08/28/2015, 7:47 AM

## 2015-08-28 NOTE — Transfer of Care (Signed)
Immediate Anesthesia Transfer of Care Note  Patient: Karl Richardson  Procedure(s) Performed: Procedure(s): COLONOSCOPY WITH PROPOFOL (N/A)  Patient Location: PACU  Anesthesia Type: MAC  Level of Consciousness: awake, alert  and patient cooperative  Airway and Oxygen Therapy: Patient Spontanous Breathing and Patient connected to supplemental oxygen  Post-op Assessment: Post-op Vital signs reviewed, Patient's Cardiovascular Status Stable, Respiratory Function Stable, Patent Airway and No signs of Nausea or vomiting  Post-op Vital Signs: Reviewed and stable  Complications: No apparent anesthesia complications

## 2015-08-28 NOTE — Anesthesia Postprocedure Evaluation (Signed)
  Anesthesia Post-op Note  Patient: Karl Richardson  Procedure(s) Performed: Procedure(s): COLONOSCOPY WITH PROPOFOL (N/A)  Anesthesia type:MAC  Patient location: PACU  Post pain: Pain level controlled  Post assessment: Post-op Vital signs reviewed, Patient's Cardiovascular Status Stable, Respiratory Function Stable, Patent Airway and No signs of Nausea or vomiting  Post vital signs: Reviewed and stable  Last Vitals:  Filed Vitals:   08/28/15 0845  BP: 118/68  Pulse: 69  Temp: 36.7 C  Resp: 14    Level of consciousness: awake, alert  and patient cooperative  Complications: No apparent anesthesia complications

## 2015-08-29 ENCOUNTER — Encounter: Payer: Self-pay | Admitting: Gastroenterology

## 2018-10-02 ENCOUNTER — Encounter: Payer: Self-pay | Admitting: Emergency Medicine

## 2018-10-02 ENCOUNTER — Emergency Department
Admission: EM | Admit: 2018-10-02 | Discharge: 2018-10-02 | Disposition: A | Discharge status: Home / Self Care | Payer: BLUE CROSS/BLUE SHIELD | Attending: Emergency Medicine | Admitting: Emergency Medicine

## 2018-10-02 ENCOUNTER — Other Ambulatory Visit: Payer: Self-pay

## 2018-10-02 ENCOUNTER — Emergency Department: Payer: BLUE CROSS/BLUE SHIELD

## 2018-10-02 DIAGNOSIS — I10 Essential (primary) hypertension: Secondary | ICD-10-CM | POA: Diagnosis not present

## 2018-10-02 DIAGNOSIS — Z79899 Other long term (current) drug therapy: Secondary | ICD-10-CM | POA: Insufficient documentation

## 2018-10-02 DIAGNOSIS — R1032 Left lower quadrant pain: Secondary | ICD-10-CM

## 2018-10-02 DIAGNOSIS — K5732 Diverticulitis of large intestine without perforation or abscess without bleeding: Secondary | ICD-10-CM

## 2018-10-02 HISTORY — DX: Essential (primary) hypertension: I10

## 2018-10-02 LAB — CBC WITH DIFFERENTIAL/PLATELET
Abs Immature Granulocytes: 0.08 10*3/uL — ABNORMAL HIGH (ref 0.00–0.07)
BASOS ABS: 0.1 10*3/uL (ref 0.0–0.1)
Basophils Relative: 0 %
EOS PCT: 1 %
Eosinophils Absolute: 0.2 10*3/uL (ref 0.0–0.5)
HEMATOCRIT: 46.7 % (ref 39.0–52.0)
HEMOGLOBIN: 16.2 g/dL (ref 13.0–17.0)
Immature Granulocytes: 1 %
LYMPHS ABS: 1.3 10*3/uL (ref 0.7–4.0)
Lymphocytes Relative: 9 %
MCH: 32.4 pg (ref 26.0–34.0)
MCHC: 34.7 g/dL (ref 30.0–36.0)
MCV: 93.4 fL (ref 80.0–100.0)
MONO ABS: 1.3 10*3/uL — AB (ref 0.1–1.0)
Monocytes Relative: 9 %
NRBC: 0 % (ref 0.0–0.2)
Neutro Abs: 11.6 10*3/uL — ABNORMAL HIGH (ref 1.7–7.7)
Neutrophils Relative %: 80 %
Platelets: 290 10*3/uL (ref 150–400)
RBC: 5 MIL/uL (ref 4.22–5.81)
RDW: 11.8 % (ref 11.5–15.5)
WBC: 14.4 10*3/uL — ABNORMAL HIGH (ref 4.0–10.5)

## 2018-10-02 LAB — COMPREHENSIVE METABOLIC PANEL
ALK PHOS: 55 U/L (ref 38–126)
ALT: 38 U/L (ref 0–44)
AST: 23 U/L (ref 15–41)
Albumin: 4.4 g/dL (ref 3.5–5.0)
Anion gap: 8 (ref 5–15)
BUN: 12 mg/dL (ref 6–20)
CO2: 23 mmol/L (ref 22–32)
CREATININE: 0.72 mg/dL (ref 0.61–1.24)
Calcium: 9 mg/dL (ref 8.9–10.3)
Chloride: 105 mmol/L (ref 98–111)
GFR calc non Af Amer: >60 mL/min (ref >60)
Glucose, Bld: 126 mg/dL — ABNORMAL HIGH (ref 70–99)
Potassium: 4.1 mmol/L (ref 3.5–5.1)
SODIUM: 136 mmol/L (ref 135–145)
Total Bilirubin: 1 mg/dL (ref 0.3–1.2)
Total Protein: 7.2 g/dL (ref 6.5–8.1)

## 2018-10-02 MED ORDER — KETOROLAC TROMETHAMINE 30 MG/ML IJ SOLN
15.0000 mg | INTRAMUSCULAR | Status: AC
  Administered 2018-10-02: 15 mg via INTRAVENOUS
  Filled 2018-10-02: qty 1

## 2018-10-02 MED ORDER — IOPAMIDOL (ISOVUE-300) INJECTION 61%
100.0000 mL | Freq: Once | INTRAVENOUS | Status: AC | PRN
  Administered 2018-10-02: 100 mL via INTRAVENOUS

## 2018-10-02 MED ORDER — CIPROFLOXACIN HCL 500 MG PO TABS: 500.0000 mg | ORAL_TABLET | Freq: Two times a day (BID) | ORAL | 0 refills | Status: AC

## 2018-10-02 MED ORDER — SODIUM CHLORIDE 0.9 % IV BOLUS: 500.0000 mL | Freq: Once | INTRAVENOUS | Status: DC

## 2018-10-02 MED ORDER — METRONIDAZOLE 500 MG PO TABS: 500.0000 mg | ORAL_TABLET | Freq: Three times a day (TID) | ORAL | 0 refills | Status: AC

## 2018-10-02 MED ORDER — NAPROXEN 500 MG PO TABS: 500.0000 mg | ORAL_TABLET | Freq: Two times a day (BID) | ORAL | 0 refills | Status: DC

## 2018-10-02 MED ORDER — ONDANSETRON 4 MG PO TBDP: 4.0000 mg | ORAL_TABLET | Freq: Three times a day (TID) | ORAL | 0 refills | Status: DC | PRN

## 2018-10-02 NOTE — ED Provider Notes (Signed)
[facility] Emergency Department Provider Note  ____________________________________________  Time seen: Approximately 11:34 AM  I have reviewed the triage vital signs and the nursing notes.   HISTORY  Chief Complaint Abdominal Pain    HPI Karl Richardson is a 57 y.o. male with a history of hypertension and sigmoid diverticulitis who complains of left lower quadrant abdominal pain that started last night, gradual onset, worsening.  Nonradiating.  No aggravating or alleviating factors.  Feels sharp and crampy.  Feels like previous episode of diverticulitis.  Review of electronic medical record shows that at that time he had microperforations of the sigmoid diverticulitis and was hospitalized for IV antibiotics and observation.      Past Medical History:  Diagnosis Date  . Diverticulitis   . Hypertension   . Medical history non-contributory      Patient Active Problem List   Diagnosis Date Noted  . Diverticulitis of large intestine without perforation or abscess without bleeding   . Diverticulosis of large intestine without diverticulitis   . Second degree hemorrhoids   . Diverticulitis 07/14/2015  . Sigmoid diverticulitis      Past Surgical History:  Procedure Laterality Date  . COLONOSCOPY WITH PROPOFOL N/A 08/28/2015   Procedure: COLONOSCOPY WITH PROPOFOL;  Surgeon: Midge Minium, MD;  [facility];  Service: Endoscopy;  Laterality: N/A;  . NO PAST SURGERIES       Prior to Admission medications   Medication Sig Start Date End Date Taking? Authorizing Provider  Multiple Vitamin (MULTIVITAMIN WITH MINERALS) TABS tablet Take 1 tablet by mouth daily.   Yes [provider]  ciprofloxacin (CIPRO) 500 MG tablet Take 1 tablet (500 mg total) by mouth 2 (two) times daily for 8 days. 10/02/18 10/10/18  Sharman Cheek, MD  metroNIDAZOLE (FLAGYL) 500 MG tablet Take 1 tablet (500 mg total) by mouth 3 (three) times daily for 10 days.  10/02/18 10/12/18  Sharman Cheek, MD  naproxen (NAPROSYN) 500 MG tablet Take 1 tablet (500 mg total) by mouth 2 (two) times daily with a meal. 10/02/18   Sharman Cheek, MD  ondansetron (ZOFRAN ODT) 4 MG disintegrating tablet Take 1 tablet (4 mg total) by mouth every 8 (eight) hours as needed for nausea or vomiting. 10/02/18   Sharman Cheek, MD     Allergies Patient has no known allergies.   Family History  Problem Relation Age of Onset  . Hypertension Father   . Cancer Father     Social History Social History   Tobacco Use  . Smoking status: Never Smoker  . Smokeless tobacco: Never Used  Substance Use Topics  . Alcohol use: Yes    Alcohol/week: 42.0 standard drinks    Types: 42 Cans of beer per week  . Drug use: Yes    Types: Marijuana    Review of Systems  Constitutional:   No fever or chills.  ENT:   No sore throat. No rhinorrhea. Cardiovascular:   No chest pain or syncope. Respiratory:   No dyspnea or cough. Gastrointestinal:   Positive as above for abdominal pain without vomiting and diarrhea.  Musculoskeletal:   Negative for focal pain or swelling All other systems reviewed and are negative except as documented above in ROS and HPI.  ____________________________________________   PHYSICAL EXAM:  VITAL SIGNS: ED Triage Vitals  Enc Vitals Group     BP 10/02/18 0753 140/86     Pulse Rate 10/02/18 0753 75     Resp 10/02/18 0753 17  Temp 10/02/18 0753 98.4 F (36.9 C)     Temp Source 10/02/18 0753 Oral     SpO2 10/02/18 0753 99 %     Weight 10/02/18 0754 150 lb (68 kg)     Height 10/02/18 0754 5\' 6"  (1.676 m)     Head Circumference --      Peak Flow --      Pain Score 10/02/18 0802 8     Pain Loc --      Pain Edu? --      Excl. in GC? --     Vital signs reviewed, nursing assessments reviewed.   Constitutional:   Alert and oriented. Non-toxic appearance. Eyes:   Conjunctivae are normal. EOMI. PERRL. ENT      Head:   Normocephalic and  atraumatic.      Nose:   No congestion/rhinnorhea.       Mouth/Throat:   MMM, no pharyngeal erythema. No peritonsillar mass.       Neck:   No meningismus. Full ROM. Hematological/Lymphatic/Immunilogical:   No cervical lymphadenopathy. Cardiovascular:   RRR. Symmetric bilateral radial and DP pulses.  No murmurs. Cap refill less than 2 seconds. Respiratory:   Normal respiratory effort without tachypnea/retractions. Breath sounds are clear and equal bilaterally. No wheezes/rales/rhonchi. Gastrointestinal:   Soft with suprapubic and left lower quadrant tenderness. Non distended. There is no CVA tenderness.  No rebound, rigidity, or guarding.  Musculoskeletal:   Normal range of motion in all extremities. No joint effusions.  No lower extremity tenderness.  No edema. Neurologic:   Normal speech and language.  Motor grossly intact. No acute focal neurologic deficits are appreciated.  Skin:    Skin is warm, dry and intact. No rash noted.  No petechiae, purpura, or bullae.  ____________________________________________    LABS (pertinent positives/negatives) (all labs ordered are listed, but only abnormal results are displayed) Labs Reviewed  COMPREHENSIVE METABOLIC PANEL - Abnormal; Notable for the following components:      Result Value   Glucose, Bld 126 (*)    All other components within normal limits  CBC WITH DIFFERENTIAL/PLATELET - Abnormal; Notable for the following components:   WBC 14.4 (*)    Neutro Abs 11.6 (*)    Monocytes Absolute 1.3 (*)    Abs Immature Granulocytes 0.08 (*)    All other components within normal limits  URINALYSIS, COMPLETE (UACMP) WITH MICROSCOPIC   ____________________________________________   EKG    ____________________________________________    RADIOLOGY  Ct Abdomen Pelvis W Contrast  Result Date: 10/02/2018 CLINICAL DATA:  Elevated white blood cell count and abdominal pain EXAM: CT ABDOMEN AND PELVIS WITH CONTRAST TECHNIQUE: Multidetector  CT imaging of the abdomen and pelvis was performed using the standard protocol following bolus administration of intravenous contrast. CONTRAST:  100mL ISOVUE-300 IOPAMIDOL (ISOVUE-300) INJECTION 61% COMPARISON:  July 14, 2015 FINDINGS: Lower chest: There is mild bibasilar atelectatic change. Hepatobiliary: No focal liver lesions are evident. Gallbladder wall is not appreciably thickened. There is no biliary duct dilatation. Pancreas: No pancreatic mass or inflammatory focus. Spleen: No splenic lesions are evident. Adrenals/Urinary Tract: Adrenals bilaterally appear within normal limits. There is a cyst in the medial mid left kidney measuring 1.0 x 0.6 cm. A cyst more posteriorly in the mid left kidney measures 8 x 7 mm. There is a cyst in the lateral lower pole left kidney measuring 5 x 5 mm. There is no hydronephrosis on either side. There is a staghorn type calculus in the lower pole of the left  kidney measuring 1.5 x 1.1 cm. There is a nearby 2 mm calculus in this area. There is a calculus slightly more medially in the lower pole left kidney region measuring 1.1 x 0.7 cm. There is a 2 mm calculus in the lower pole the right kidney. There is no appreciable ureteral calculus on either side. Urinary bladder is midline with slight thickening of the urinary bladder wall. Stomach/Bowel: There is inflammatory change in the mid sigmoid colon region with wall thickening and several irregular diverticula. There is mild loculated fluid in this area and mesenteric stranding. There is no frank abscess or perforation in this area of mid sigmoid colon diverticulitis. There is localized panniculitis in this area due to inflammation from the diverticulitis. No inflammatory changes involving bowel noted elsewhere. No evident bowel obstruction. No abscess in the abdomen or pelvis. Vascular/Lymphatic: There is aorto biiliac atherosclerosis. No aneurysm evident. Major mesenteric arterial vessels are patent. There is no adenopathy  in the abdomen or pelvis. Reproductive: There are prostatic calculi. Prostate and seminal vesicles are normal in size and contour. There is no pelvic mass. Other: There is no demonstrable appendiceal inflammation. No frank abscess noted in the abdomen or pelvis. No ascites evident in the abdomen or pelvis. Musculoskeletal: There are no blastic or lytic bone lesions. There is no intramuscular or abdominal wall lesions evident. IMPRESSION: 1. Mid sigmoid colonic diverticulitis. There is moderate wall thickening and mesenteric inflammatory change in this area. No perforation or abscess noted. 2. Mild urinary bladder wall thickening which may be in part secondary to nearby diverticulitis. Correlation with urinalysis to assess for potential cystitis advised. 3. Multiple renal calculi on the left including staghorn type calculus in lower pole region. Small calculus lower pole right kidney. No hydronephrosis or ureteral calculus on either side. Several prostatic calculi noted. 4.  Aortoiliac atherosclerosis.  No aneurysm evident. Aortic Atherosclerosis (ICD10-I70.0). Electronically Signed   By: Bretta BangWilliam  Woodruff III M.D.   On: 10/02/2018 09:31    ____________________________________________   PROCEDURES Procedures  ____________________________________________  DIFFERENTIAL DIAGNOSIS   Kidney stone, diverticulitis, bowel perforation, intra-abdominal abscess, cystitis  CLINICAL IMPRESSION / ASSESSMENT AND PLAN / ED COURSE  Pertinent labs & imaging results that were available during my care of the patient were reviewed by me and considered in my medical decision making (see chart for details).    Patient presents with left lower quadrant abdominal pain, exam concerning for diverticulitis.  Vital signs are normal.  Given his previous complicated diverticulitis I will check labs.  If normal we can treat empirically.  ----------------------------------------- 11:36 AM on  10/02/2018 -----------------------------------------  White blood cell count was elevated at 14,000 so I obtained a CT scan to ensure there were no significant complications.  This did show uncomplicated sigmoid diverticulitis as suspected.  Start the patient on Cipro Flagyl, symptomatic management with naproxen and Zofran, follow-up with general surgery for further evaluation.     ____________________________________________   FINAL CLINICAL IMPRESSION(S) / ED DIAGNOSES    Final diagnoses:  Sigmoid diverticulitis  Left lower quadrant abdominal pain     ED Discharge Orders         Ordered    ciprofloxacin (CIPRO) 500 MG tablet  2 times daily     10/02/18 1132    metroNIDAZOLE (FLAGYL) 500 MG tablet  3 times daily     10/02/18 1132    ondansetron (ZOFRAN ODT) 4 MG disintegrating tablet  Every 8 hours PRN     10/02/18 1132    naproxen (  NAPROSYN) 500 MG tablet  2 times daily with meals     10/02/18 1132          Portions of this note were generated with dragon dictation software. Dictation errors may occur despite best attempts at proofreading.   Sharman Cheek, MD 10/02/18 1136

## 2018-10-02 NOTE — ED Triage Notes (Signed)
Pt with low, mid abd pain that woke him up last night. Pain described as sharp and cramping. Pt with hx diverticulitis and states this feels similar.

## 2018-10-02 NOTE — ED Notes (Signed)
Pt states pain feels like diverticulitis, pt has hx of diverticulitis.

## 2018-10-02 NOTE — ED Notes (Signed)
Pt family member to desk at this time, pt and family informed waiting further orders from EDP at this time.

## 2018-10-02 NOTE — Discharge Instructions (Signed)
Your labs do show a slightly elevated white blood cell count but are otherwise normal.  Your CT scan confirms that this is sigmoid diverticulitis, without major complications.  Take ciprofloxacin and Flagyl antibiotics to help treat this illness.  Anti-inflammatory medicine and nausea medicine can be helpful as well.  Be sure to drink lots of fluids to stay well-hydrated.

## 2018-10-05 ENCOUNTER — Encounter: Payer: Self-pay | Admitting: *Deleted

## 2018-10-23 ENCOUNTER — Ambulatory Visit
Admission: RE | Admit: 2018-10-23 | Discharge: 2018-10-23 | Disposition: A | Discharge status: Home / Self Care | Payer: BLUE CROSS/BLUE SHIELD | Source: Ambulatory Visit | Attending: Surgery | Admitting: Surgery

## 2018-10-23 ENCOUNTER — Encounter: Payer: Self-pay | Admitting: Surgery

## 2018-10-23 ENCOUNTER — Ambulatory Visit: Payer: BLUE CROSS/BLUE SHIELD | Admitting: Surgery

## 2018-10-23 ENCOUNTER — Telehealth: Payer: Self-pay | Admitting: *Deleted

## 2018-10-23 ENCOUNTER — Other Ambulatory Visit: Payer: Self-pay

## 2018-10-23 ENCOUNTER — Encounter: Payer: Self-pay | Admitting: *Deleted

## 2018-10-23 ENCOUNTER — Other Ambulatory Visit
Admission: RE | Admit: 2018-10-23 | Discharge: 2018-10-23 | Disposition: A | Discharge status: Home / Self Care | Payer: BLUE CROSS/BLUE SHIELD | Source: Ambulatory Visit | Attending: Surgery | Admitting: Surgery

## 2018-10-23 VITALS — BP 134/84 | HR 68 | Temp 97.9°F | Ht 65.0 in | Wt 174.0 lb

## 2018-10-23 DIAGNOSIS — K5732 Diverticulitis of large intestine without perforation or abscess without bleeding: Secondary | ICD-10-CM

## 2018-10-23 DIAGNOSIS — Z01818 Encounter for other preprocedural examination: Secondary | ICD-10-CM | POA: Diagnosis not present

## 2018-10-23 LAB — COMPREHENSIVE METABOLIC PANEL
ALBUMIN: 4 g/dL (ref 3.5–5.0)
ALK PHOS: 48 U/L (ref 38–126)
ALT: 32 U/L (ref 0–44)
ANION GAP: 6 (ref 5–15)
AST: 21 U/L (ref 15–41)
BUN: 10 mg/dL (ref 6–20)
CALCIUM: 9 mg/dL (ref 8.9–10.3)
CHLORIDE: 106 mmol/L (ref 98–111)
CO2: 27 mmol/L (ref 22–32)
CREATININE: 0.83 mg/dL (ref 0.61–1.24)
GFR calc Af Amer: >60 mL/min (ref >60)
GFR calc non Af Amer: >60 mL/min (ref >60)
GLUCOSE: 107 mg/dL — AB (ref 70–99)
Potassium: 4.1 mmol/L (ref 3.5–5.1)
SODIUM: 139 mmol/L (ref 135–145)
Total Bilirubin: 1.1 mg/dL (ref 0.3–1.2)
Total Protein: 6.4 g/dL — ABNORMAL LOW (ref 6.5–8.1)

## 2018-10-23 LAB — CBC WITH DIFFERENTIAL/PLATELET
Abs Immature Granulocytes: 0.03 10*3/uL (ref 0.00–0.07)
BASOS ABS: 0.1 10*3/uL (ref 0.0–0.1)
Basophils Relative: 1 %
EOS ABS: 0.4 10*3/uL (ref 0.0–0.5)
Eosinophils Relative: 5 %
HEMATOCRIT: 43.5 % (ref 39.0–52.0)
HEMOGLOBIN: 15 g/dL (ref 13.0–17.0)
IMMATURE GRANULOCYTES: 0 %
LYMPHS ABS: 1.6 10*3/uL (ref 0.7–4.0)
LYMPHS PCT: 23 %
MCH: 32.3 pg (ref 26.0–34.0)
MCHC: 34.5 g/dL (ref 30.0–36.0)
MCV: 93.5 fL (ref 80.0–100.0)
MONOS PCT: 11 %
Monocytes Absolute: 0.8 10*3/uL (ref 0.1–1.0)
NEUTROS PCT: 60 %
Neutro Abs: 4.2 10*3/uL (ref 1.7–7.7)
Platelets: 277 10*3/uL (ref 150–400)
RBC: 4.65 MIL/uL (ref 4.22–5.81)
RDW: 11.7 % (ref 11.5–15.5)
WBC: 7.1 10*3/uL (ref 4.0–10.5)
nRBC: 0 % (ref 0.0–0.2)

## 2018-10-23 NOTE — Progress Notes (Signed)
Patient was sent to [facility] today to have the following labs drawn: CBC and CMP.  He will also have a chest x-ray 2 view and an EKG.   The labs and chest x-ray are walk-in tests which patient is aware no appointment is needed.   The EKG is scheduled for 11 am today (arrive 10:45 am).   The patient is aware to inform the registration desk that he is registering for all the above to be completed before he leaves [facility] today.   Patient will need to follow up in the office in 2 weeks.

## 2018-10-23 NOTE — Telephone Encounter (Signed)
Patient called the office back and spoke with Nicholaus Bloom.   An appointment has been scheduled for the patient to see Dr. Everlene Farrier on 11-06-18 at 9 am for follow up.

## 2018-10-23 NOTE — Telephone Encounter (Signed)
Message left for patient to call the office.   We need to schedule patient to see Dr. Everlene Farrier in 2 weeks.

## 2018-10-25 ENCOUNTER — Encounter: Payer: Self-pay | Admitting: Surgery

## 2018-10-25 NOTE — Progress Notes (Signed)
Patient ID: Karl Richardson Amor, male   DOB: 01-Mar-1961, 58 y.o.   MRN: [id number]  HPI Karl Richardson Ask is a 58 y.o. male seen in consultation at the request of Dr. Scotty CourtStafford.  He has a well-known history of diverticulitis with microperforation that required hospitalization more than 3 years ago.  He did have a colonoscopy that time showing no evidence of malignancy.  More recently went to the emergency room 3 weeks ago with another recurrent attack.  She presented with left lower quadrant pain sharp intermittent and moderate intensity.  Currently his symptoms have subsided.  Is able to perform more than 4 METS of activity .  He does have some occasional chest pain that is not related to exercise.  He has not been in doctor for more than 5 years and has no current chest x-ray, EKG or physical ever. Did have a CT scan of the abdomen pelvis 3 weeks ago showing evidence of diverticulitis.  No evidence of perforation.  There is some renal calculi. CBC and CMP is completely normal  HPI  Past Medical History:  Diagnosis Date  . Diverticulitis   . Hypertension   . Medical history non-contributory     Past Surgical History:  Procedure Laterality Date  . COLONOSCOPY WITH PROPOFOL N/A 08/28/2015   Procedure: COLONOSCOPY WITH PROPOFOL;  Surgeon: Midge Miniumarren Wohl, MD;  [facility];  Service: Endoscopy;  Laterality: N/A;  . NO PAST SURGERIES      Family History  Problem Relation Age of Onset  . Hypertension Father   . Cancer Father     Social History Social History   Tobacco Use  . Smoking status: Never Smoker  . Smokeless tobacco: Never Used  Substance Use Topics  . Alcohol use: Yes    Alcohol/week: 42.0 standard drinks    Types: 42 Cans of beer per week  . Drug use: Yes    Types: Marijuana    No Known Allergies  Current Outpatient Medications  Medication Sig Dispense Refill  . Multiple Vitamin (MULTIVITAMIN WITH MINERALS) TABS tablet Take 1 tablet by mouth daily.     No current  facility-administered medications for this visit.      Review of Systems Full ROS  was asked and was negative except for the information on the HPI  Physical Exam Blood pressure 134/84, pulse 68, temperature 97.9 F (36.6 C), temperature source Skin, height 5\' 5"  (1.651 m), weight 174 lb (78.9 kg), SpO2 98 %. CONSTITUTIONAL: NAD EYES: Pupils are equal, round, and reactive to light, Sclera are non-icteric. EARS, NOSE, MOUTH AND THROAT: The oropharynx is clear. The oral mucosa is pink and moist. Hearing is intact to voice. LYMPH NODES:  Lymph nodes in the neck are normal. RESPIRATORY:  Lungs are clear. There is normal respiratory effort, with equal breath sounds bilaterally, and without pathologic use of accessory muscles. CARDIOVASCULAR: Heart is regular without murmurs, gallops, or rubs. GI: The abdomen is soft, nontender, and nondistended. There are no palpable masses. There is no hepatosplenomegaly. There are normal bowel sounds in all quadrants. GU: Rectal deferred.   MUSCULOSKELETAL: Normal muscle strength and tone. No cyanosis or edema.   SKIN: Turgor is good and there are no pathologic skin lesions or ulcers. NEUROLOGIC: Motor and sensation is grossly normal. Cranial nerves are grossly intact. PSYCH:  Oriented to person, place and time. Affect is normal.  Data Reviewed  I have personally reviewed the patient's imaging, laboratory findings and medical records.    Assessment/Plan 58 yo male with  recurrent diverticulitis with one requiring hospitalization. The pt has not have any w/u or physical. HE does have some atypical chest pain. I would start basic w/u including CXR and ekg.  HE is not 100% sure if he would like to proceed with sigmoid colectomy. D/W him about the evidence behind colectomy for diverticulitis. We will start w basic w/u . I did explain to him about the details regarding sigmoid colectomy. Risks, benefits and possible complications. I will see him back after basic  w/u completing and he will think about potential surgery.Extensive counseling provided. No need for emergent surgical intervention. Copy of the report send to referring provider.  Sterling Bigiego Marylen Zuk, MD FACS General Surgeon 10/25/2018, 11:51 AM

## 2018-10-26 ENCOUNTER — Telehealth: Payer: Self-pay

## 2018-10-26 NOTE — Telephone Encounter (Signed)
-----   Message from Leafy Roiego F Pabon, MD sent at 10/26/2018 12:07 PM EST ----- Please let him know that labs are ok ----- Message ----- From: Interface, Lab In [place] Sent: 10/23/2018  11:21 AM EST To: Leafy Roiego F Pabon, MD

## 2018-10-26 NOTE — Telephone Encounter (Signed)
Left message stating CXR normal per Dr.Pabon and to call office if he has questions.

## 2018-10-26 NOTE — Telephone Encounter (Signed)
Notified patient as instructed, patient pleased. Discussed follow-up appointments,   

## 2018-11-06 ENCOUNTER — Encounter: Payer: Self-pay | Admitting: *Deleted

## 2018-11-06 ENCOUNTER — Encounter: Payer: Self-pay | Admitting: Surgery

## 2018-11-06 ENCOUNTER — Telehealth: Payer: Self-pay | Admitting: *Deleted

## 2018-11-06 ENCOUNTER — Other Ambulatory Visit: Payer: Self-pay

## 2018-11-06 ENCOUNTER — Ambulatory Visit: Payer: BLUE CROSS/BLUE SHIELD | Admitting: Surgery

## 2018-11-06 VITALS — BP 136/81 | HR 73 | Temp 98.1°F | Ht 65.0 in | Wt 154.0 lb

## 2018-11-06 DIAGNOSIS — K5732 Diverticulitis of large intestine without perforation or abscess without bleeding: Secondary | ICD-10-CM | POA: Diagnosis not present

## 2018-11-06 NOTE — Telephone Encounter (Signed)
Patient's mom, Harriett Sine contacted today and notified that we need to change surgery date from 11-14-18 to 11-16-18 due to scheduling issues.   She is agreeable and will inform the patient.   Mom is also aware that patient will need to Pre-admit on 11-08-18 at 7:30 am.   She was instructed to call the office should they have further questions.

## 2018-11-06 NOTE — Progress Notes (Addendum)
Patient's surgery to be scheduled for 11-14-18 at [facility] with Dr. Everlene Farrier. Dr. Satira Mccallum will be assisting with this case.   *Patient will need to complete a formal bowel prep. Instructions were reviewed with the patient today. Prescriptions have been called in to [facility] per patient request. *Pharmacy unable to get erythromycin. Patient will take Flagyl instead.   The patient is aware he will need to Pre-Admit. Patient will check in at [facility], [address] (first floor). The patient will be contacted with date and time once [facility] arranges.   The patient is aware to call the office should he have further questions.

## 2018-11-06 NOTE — H&P (View-Only) (Signed)
Outpatient Surgical Follow Up  11/06/2018  Karl Richardson is an 58 y.o. male.   Chief Complaint  Patient presents with  . Follow-up    2 week follow up diverticulitis  labs, chest xray and  ekg done at [facility]    HPI: Mr. rakes is following up for a history of recurrent Complicated diverticulitis.  I personally reviewed his last CT showing evidence of diverticulitis.  In 2016 he did have an episode of  microperforation requiring hospitalization .  He also had a colonoscopy in 2016 showing no evidence of any cancer and a 10-year follow-up was recommended. Is doing well.  No fevers no chills no abdominal pain no weight loss.  He is able to perform more than 4 METS of activity without any shortness of breath or chest pain.  I have personally reviewed his chest x-ray without any acute abnormalities.  CBC and CMP are completely normal.  Past Medical History:  Diagnosis Date  . Diverticulitis   . Hypertension   . Medical history non-contributory     Past Surgical History:  Procedure Laterality Date  . COLONOSCOPY WITH PROPOFOL N/A 08/28/2015   Procedure: COLONOSCOPY WITH PROPOFOL;  Surgeon: Darren Wohl, MD;  Location: [facility];  Service: Endoscopy;  Laterality: N/A;  . NO PAST SURGERIES      Family History  Problem Relation Age of Onset  . Hypertension Father   . Cancer Father     Social History:  reports that he has never smoked. He has never used smokeless tobacco. He reports current alcohol use of about 42.0 standard drinks of alcohol per week. He reports current drug use. Drug: Marijuana.  Allergies: No Known Allergies  Medications reviewed.    ROS Full ROS performed and is otherwise negative other than what is stated in HPI   BP 136/81   Pulse 73   Temp 98.1 F (36.7 C) (Temporal)   Ht 5' 5" (1.651 m)   Wt 154 lb (69.9 kg)   SpO2 97%   BMI 25.63 kg/m   Physical Exam Vitals signs and nursing note reviewed. Exam conducted with a chaperone present.   Constitutional:      General: He is not in acute distress.    Appearance: Normal appearance. He is normal weight. He is not ill-appearing.  Eyes:     General: No scleral icterus.       Right eye: No discharge.        Left eye: No discharge.     Conjunctiva/sclera: Conjunctivae normal.  Neck:     Musculoskeletal: Normal range of motion and neck supple. No muscular tenderness.  Cardiovascular:     Rate and Rhythm: Normal rate and regular rhythm.     Heart sounds: Normal heart sounds. No murmur.  Pulmonary:     Effort: Pulmonary effort is normal. No respiratory distress.     Breath sounds: Normal breath sounds. No stridor. No wheezing.  Abdominal:     General: Abdomen is flat. There is no distension.     Palpations: There is no mass.     Tenderness: There is no abdominal tenderness. There is no guarding or rebound.     Hernia: A hernia is present.     Comments: Very small reducible [facility]  Musculoskeletal: Normal range of motion.        General: No swelling.  Skin:    General: Skin is warm and dry.     Capillary Refill: Capillary refill takes less than 2 seconds.  Neurological:       General: No focal deficit present.     Mental Status: He is alert and oriented to person, place, and time.  Psychiatric:        Mood and Affect: Mood normal.        Behavior: Behavior normal.        Thought Content: Thought content normal.        Judgment: Judgment normal.    Assessment/Plan: 58 year old male with a history of complicated recurrent diverticulitis.  I had an extensive discussion with the patient regarding the rationale for elective laparoscopic sigmoid colectomy.  The benefit of symptom control. So discussed with him in detail about the operation.  We will plan for laparoscopic sigmoid colectomy.  Risk, benefits and possible complications including but not limited to: Bleeding, infection, leak, chronic pain and need for re-interventions were discussed with the patient in detail.  He  Understands and wishes to proceed. Given the fact that he had had a colonoscopy less than 4 years ago and a recent CT that does not suggest any evidence of malignancy I do think that is reasonable to proceed without a repeat colonoscopy at this time Greater than 50% of the 25 minutes  visit was spent in counseling/coordination of care   Sterling Big, MD [facility] General Surgeon

## 2018-11-06 NOTE — Progress Notes (Signed)
Outpatient Surgical Follow Up  11/06/2018  Karl Richardson is an 58 y.o. male.   Chief Complaint  Patient presents with  . Follow-up    2 week follow up diverticulitis  labs, chest xray and  ekg done at [facility]    HPI: Karl Richardson is following up for a history of recurrent Complicated diverticulitis.  I personally reviewed his last CT showing evidence of diverticulitis.  In 2016 he did have an episode of  microperforation requiring hospitalization .  He also had a colonoscopy in 2016 showing no evidence of any cancer and a 10-year follow-up was recommended. Is doing well.  No fevers no chills no abdominal pain no weight loss.  He is able to perform more than 4 METS of activity without any shortness of breath or chest pain.  I have personally reviewed his chest x-ray without any acute abnormalities.  CBC and CMP are completely normal.  Past Medical History:  Diagnosis Date  . Diverticulitis   . Hypertension   . Medical history non-contributory     Past Surgical History:  Procedure Laterality Date  . COLONOSCOPY WITH PROPOFOL N/A 08/28/2015   Procedure: COLONOSCOPY WITH PROPOFOL;  Surgeon: Midge Minium, MD;  Location: [facility];  Service: Endoscopy;  Laterality: N/A;  . NO PAST SURGERIES      Family History  Problem Relation Age of Onset  . Hypertension Father   . Cancer Father     Social History:  reports that he has never smoked. He has never used smokeless tobacco. He reports current alcohol use of about 42.0 standard drinks of alcohol per week. He reports current drug use. Drug: Marijuana.  Allergies: No Known Allergies  Medications reviewed.    ROS Full ROS performed and is otherwise negative other than what is stated in HPI   BP 136/81   Pulse 73   Temp 98.1 F (36.7 C) (Temporal)   Ht 5\' 5"  (1.651 m)   Wt 154 lb (69.9 kg)   SpO2 97%   BMI 25.63 kg/m   Physical Exam Vitals signs and nursing note reviewed. Exam conducted with a chaperone present.   Constitutional:      General: He is not in acute distress.    Appearance: Normal appearance. He is normal weight. He is not ill-appearing.  Eyes:     General: No scleral icterus.       Right eye: No discharge.        Left eye: No discharge.     Conjunctiva/sclera: Conjunctivae normal.  Neck:     Musculoskeletal: Normal range of motion and neck supple. No muscular tenderness.  Cardiovascular:     Rate and Rhythm: Normal rate and regular rhythm.     Heart sounds: Normal heart sounds. No murmur.  Pulmonary:     Effort: Pulmonary effort is normal. No respiratory distress.     Breath sounds: Normal breath sounds. No stridor. No wheezing.  Abdominal:     General: Abdomen is flat. There is no distension.     Palpations: There is no mass.     Tenderness: There is no abdominal tenderness. There is no guarding or rebound.     Hernia: A hernia is present.     Comments: Very small reducible [facility]  Musculoskeletal: Normal range of motion.        General: No swelling.  Skin:    General: Skin is warm and dry.     Capillary Refill: Capillary refill takes less than 2 seconds.  Neurological:  General: No focal deficit present.     Mental Status: He is alert and oriented to person, place, and time.  Psychiatric:        Mood and Affect: Mood normal.        Behavior: Behavior normal.        Thought Content: Thought content normal.        Judgment: Judgment normal.    Assessment/Plan: 58 year old male with a history of complicated recurrent diverticulitis.  I had an extensive discussion with the patient regarding the rationale for elective laparoscopic sigmoid colectomy.  The benefit of symptom control. So discussed with him in detail about the operation.  We will plan for laparoscopic sigmoid colectomy.  Risk, benefits and possible complications including but not limited to: Bleeding, infection, leak, chronic pain and need for re-interventions were discussed with the patient in detail.  He  Understands and wishes to proceed. Given the fact that he had had a colonoscopy less than 4 years ago and a recent CT that does not suggest any evidence of malignancy I do think that is reasonable to proceed without a repeat colonoscopy at this time Greater than 50% of the 25 minutes  visit was spent in counseling/coordination of care   Sterling Big, MD [facility] General Surgeon

## 2018-11-06 NOTE — Patient Instructions (Signed)
Patient will need to be scheduled for surgery with Dr.Pabon.  Call the office with any questions or concerns. Diverticulitis  Diverticulitis is when small pockets in your large intestine (colon) get infected or swollen. This causes stomach pain and watery poop (diarrhea). These pouches are called diverticula. They form in people who have a condition called diverticulosis. Follow these instructions at home: Medicines  Take over-the-counter and prescription medicines only as told by your doctor. These include: ? Antibiotics. ? Pain medicines. ? Fiber pills. ? Probiotics. ? Stool softeners.  Do not drive or use heavy machinery while taking prescription pain medicine.  If you were prescribed an antibiotic, take it as told. Do not stop taking it even if you feel better. General instructions   Follow a diet as told by your doctor.  When you feel better, your doctor may tell you to change your diet. You may need to eat a lot of fiber. Fiber makes it easier to poop (have bowel movements). Healthy foods with fiber include: ? Berries. ? Beans. ? Lentils. ? Green vegetables.  Exercise 3 or more times a week. Aim for 30 minutes each time. Exercise enough to sweat and make your heart beat faster.  Keep all follow-up visits as told. This is important. You may need to have an exam of the large intestine. This is called a colonoscopy. Contact a doctor if:  Your pain does not get better.  You have a hard time eating or drinking.  You are not pooping like normal. Get help right away if:  Your pain gets worse.  Your problems do not get better.  Your problems get worse very fast.  You have a fever.  You throw up (vomit) more than one time.  You have poop that is: ? Bloody. ? Black. ? Tarry. Summary  Diverticulitis is when small pockets in your large intestine (colon) get infected or swollen.  Take medicines only as told by your doctor.  Follow a diet as told by your  doctor. This information is not intended to replace advice given to you by your health care provider. Make sure you discuss any questions you have with your health care provider. Document Released: 03/15/2008 Document Revised: 10/14/2016 Document Reviewed: 10/14/2016 Elsevier Interactive Patient Education  2019 [employer].

## 2018-11-08 ENCOUNTER — Other Ambulatory Visit: Payer: Self-pay

## 2018-11-08 ENCOUNTER — Encounter
Admission: RE | Admit: 2018-11-08 | Discharge: 2018-11-08 | Disposition: A | Discharge status: Home / Self Care | Payer: BLUE CROSS/BLUE SHIELD | Source: Ambulatory Visit | Attending: Surgery | Admitting: Surgery

## 2018-11-08 DIAGNOSIS — Z01812 Encounter for preprocedural laboratory examination: Secondary | ICD-10-CM | POA: Insufficient documentation

## 2018-11-08 LAB — TYPE AND SCREEN
ABO/RH(D): A POS
Antibody Screen: NEGATIVE

## 2018-11-08 NOTE — Patient Instructions (Signed)
  Your procedure is scheduled on: Thursday November 16, 2018 Report to Same Day Surgery 2nd floor [facility] Entrance-take elevator on left to 2nd floor.  Check in with surgery information desk.) To find out your arrival time, call [phone] 1:00-3:00 PM on Wednesday November 15, 2018  Remember: Instructions that are not followed completely may result in serious medical risk, up to and including death, or upon the discretion of your surgeon and anesthesiologist your surgery may need to be rescheduled.    __x__ 1. Do not eat food (including mints, candies, chewing gum) after midnight the night before your procedure. You may drink clear liquids up to 2 hours before you are scheduled to arrive at the hospital for your procedure.  Do not drink anything within 2 hours of your scheduled arrival to the hospital.  Approved clear liquids:  --Water or Apple juice without pulp  --Clear carbohydrate beverage such as Gatorade or Powerade  --Black Coffee or Clear Tea (No milk, no creamers, do not add anything to the coffee or tea)    __x__ 2. No Alcohol for 24 hours before or after surgery.   __x__ 3. No Smoking or e-cigarettes for 24 hours before surgery.  Do not use any chewable tobacco products for at least 6 hours before surgery.   __x__ 4. Notify your doctor if there is any change in your medical condition (cold, fever, infections).   __x__ 5. On the morning of surgery brush your teeth with toothpaste and water.  You may rinse your mouth with mouthwash if you wish.  Do not swallow any toothpaste or mouthwash.  Please read over the following fact sheets that you were given:   [facility] Preparing for Surgery and/or MRSA Information    __x__ Use CHG Soap as directed on instruction sheet.   Do not wear jewelry on the day of surgery.  Do not wear lotions, powders, deodorant, or perfumes.   Do not shave below the face/neck 48 hours prior to surgery.   Do not bring valuables to the  hospital.    [facility] is not responsible for any belongings or valuables.               Contacts, dentures or bridgework may not be worn into surgery.  Leave your suitcase in the car. After surgery it may be brought to your room.  For patients admitted to the hospital, discharge time is determined by your treatment team.  For patients discharged on the day of surgery, you will NOT be permitted to drive yourself home.  You must have a responsible adult with you for 24 hours after surgery.  __x__ Take these medicines on the morning of surgery with a SMALL SIP OF WATER:  1. NONE  __x__ Follow recommendations from Cardiologist, Pulmonologist or PCP regarding stopping blood thinners such as Aspirin, Coumadin, Plavix, Eliquis, Effient, Pradaxa, and Pletal.  __x__ Avoid Anti-inflammatories such as Advil, Ibuprofen, Motrin, Aleve, Naproxen, Naprosyn, BC/Goodies powders or aspirin products. You may continue to take Tylenol and Celebrex.   __x__ Avoid supplements until after surgery. You may continue to take your multivitamin.

## 2018-11-08 NOTE — Consult Note (Signed)
WOC Nurse requested for preoperative stoma site marking by staff in the pre-procedure testing area.  I discussed the surgical procedure with patient, and asked what he had been told about the surgery.  The patient stated his surgeon told him he "was having the surgery to prevent having to wear a bag", and that if he "was going to have to wear a bag, he wasn't going to have the surgery".  I apologized profusely for any misunderstanding, and that let's put our trust in what his surgeon told him, there must have been a misunderstanding when I was requested to see him.  He and his mother accepted my apology.  I did not mark the patient for a stoma.  Helmut Muster, RN, MSN, CWOCN, CNS-BC, pager [phone]

## 2018-11-15 ENCOUNTER — Telehealth: Payer: Self-pay | Admitting: *Deleted

## 2018-11-15 MED ORDER — SODIUM CHLORIDE 0.9 % IV SOLN
1.0000 g | INTRAVENOUS | Status: AC
  Administered 2018-11-16: 1 g via INTRAVENOUS
  Filled 2018-11-15: qty 1

## 2018-11-15 NOTE — Telephone Encounter (Signed)
Patient's mother, Harriett Sine, was contacted today.   She states son did have questions regarding bowel prep but he reviewed his paperwork and believes he figured out what he is supposed to do.   Instructions reviewed with mom and instructed that if they have further questions to call the office. She verbalizes understanding.   Surgery scheduled for 11-16-18.

## 2018-11-15 NOTE — Telephone Encounter (Signed)
Patient is scheduled for surgery tomorrow and has some prep questions.

## 2018-11-16 ENCOUNTER — Inpatient Hospital Stay: Payer: BLUE CROSS/BLUE SHIELD | Admitting: Anesthesiology

## 2018-11-16 ENCOUNTER — Encounter: Payer: Self-pay | Admitting: *Deleted

## 2018-11-16 ENCOUNTER — Other Ambulatory Visit: Payer: Self-pay

## 2018-11-16 ENCOUNTER — Encounter
Admission: AD | Disposition: A | Discharge status: Home / Self Care | Payer: Self-pay | Source: Home / Self Care | Attending: Surgery

## 2018-11-16 ENCOUNTER — Inpatient Hospital Stay
Admission: AD | Admit: 2018-11-16 | Discharge: 2018-11-18 | DRG: 331 | Disposition: A | Discharge status: Home / Self Care | Payer: BLUE CROSS/BLUE SHIELD | Attending: Surgery | Admitting: Surgery

## 2018-11-16 DIAGNOSIS — K5732 Diverticulitis of large intestine without perforation or abscess without bleeding: Secondary | ICD-10-CM | POA: Diagnosis present

## 2018-11-16 DIAGNOSIS — Z23 Encounter for immunization: Secondary | ICD-10-CM | POA: Diagnosis not present

## 2018-11-16 DIAGNOSIS — Z8249 Family history of ischemic heart disease and other diseases of the circulatory system: Secondary | ICD-10-CM

## 2018-11-16 DIAGNOSIS — F1290 Cannabis use, unspecified, uncomplicated: Secondary | ICD-10-CM | POA: Diagnosis present

## 2018-11-16 DIAGNOSIS — Z7289 Other problems related to lifestyle: Secondary | ICD-10-CM | POA: Diagnosis not present

## 2018-11-16 DIAGNOSIS — I10 Essential (primary) hypertension: Secondary | ICD-10-CM | POA: Diagnosis present

## 2018-11-16 HISTORY — PX: LAPAROSCOPIC SIGMOID COLECTOMY: SHX5928

## 2018-11-16 LAB — ABO/RH: ABO/RH(D): A POS

## 2018-11-16 LAB — CREATININE, SERUM
Creatinine, Ser: 0.83 mg/dL (ref 0.61–1.24)
GFR calc Af Amer: >60 mL/min (ref >60)
GFR calc non Af Amer: >60 mL/min (ref >60)

## 2018-11-16 LAB — URINE DRUG SCREEN, QUALITATIVE (ARMC ONLY): Tricyclic, Ur Screen: NOT DETECTED

## 2018-11-16 LAB — CBC
HCT: 42.1 % (ref 39.0–52.0)
Hemoglobin: 14.3 g/dL (ref 13.0–17.0)
MCH: 32.4 pg (ref 26.0–34.0)
MCHC: 34 g/dL (ref 30.0–36.0)
MCV: 95.2 fL (ref 80.0–100.0)
Platelets: 260 10*3/uL (ref 150–400)
RBC: 4.42 MIL/uL (ref 4.22–5.81)
RDW: 11.7 % (ref 11.5–15.5)
WBC: 19.3 10*3/uL — ABNORMAL HIGH (ref 4.0–10.5)
nRBC: 0 % (ref 0.0–0.2)

## 2018-11-16 SURGERY — COLECTOMY, SIGMOID, LAPAROSCOPIC
Anesthesia: General

## 2018-11-16 MED ORDER — LACTATED RINGERS IV SOLN
INTRAVENOUS | Status: DC
  Administered 2018-11-16: 07:00:00 via INTRAVENOUS

## 2018-11-16 MED ORDER — GABAPENTIN 300 MG PO CAPS
ORAL_CAPSULE | ORAL | Status: AC
  Administered 2018-11-16: 300 mg via ORAL
  Filled 2018-11-16: qty 1

## 2018-11-16 MED ORDER — FENTANYL CITRATE (PF) 100 MCG/2ML IJ SOLN
25.0000 ug | INTRAMUSCULAR | Status: DC | PRN
  Administered 2018-11-16 (×4): 25 ug via INTRAVENOUS

## 2018-11-16 MED ORDER — SODIUM CHLORIDE (PF) 0.9 % IJ SOLN
INTRAMUSCULAR | Status: DC | PRN
  Administered 2018-11-16: 50 mL

## 2018-11-16 MED ORDER — LIDOCAINE HCL (CARDIAC) PF 100 MG/5ML IV SOSY
PREFILLED_SYRINGE | INTRAVENOUS | Status: DC | PRN
  Administered 2018-11-16: 100 mg via INTRAVENOUS

## 2018-11-16 MED ORDER — ONDANSETRON 4 MG PO TBDP
4.0000 mg | ORAL_TABLET | Freq: Four times a day (QID) | ORAL | Status: DC | PRN
  Filled 2018-11-16: qty 1

## 2018-11-16 MED ORDER — SUGAMMADEX SODIUM 200 MG/2ML IV SOLN
INTRAVENOUS | Status: AC
  Filled 2018-11-16: qty 2

## 2018-11-16 MED ORDER — PANTOPRAZOLE SODIUM 40 MG IV SOLR
40.0000 mg | Freq: Every day | INTRAVENOUS | Status: DC
  Administered 2018-11-16 – 2018-11-17 (×2): 40 mg via INTRAVENOUS
  Filled 2018-11-16 (×2): qty 40

## 2018-11-16 MED ORDER — SODIUM CHLORIDE 0.9 % IV SOLN
INTRAVENOUS | Status: DC | PRN
  Administered 2018-11-16: 20 ug/min via INTRAVENOUS

## 2018-11-16 MED ORDER — ACETAMINOPHEN 10 MG/ML IV SOLN
INTRAVENOUS | Status: DC | PRN
  Administered 2018-11-16: 1000 mg via INTRAVENOUS

## 2018-11-16 MED ORDER — MIDAZOLAM HCL 2 MG/2ML IJ SOLN
INTRAMUSCULAR | Status: AC
  Filled 2018-11-16: qty 2

## 2018-11-16 MED ORDER — ENOXAPARIN SODIUM 40 MG/0.4ML ~~LOC~~ SOLN
40.0000 mg | SUBCUTANEOUS | Status: DC
  Administered 2018-11-17 – 2018-11-18 (×2): 40 mg via SUBCUTANEOUS
  Filled 2018-11-16 (×2): qty 0.4

## 2018-11-16 MED ORDER — ACETAMINOPHEN 500 MG PO TABS
ORAL_TABLET | ORAL | Status: AC
  Administered 2018-11-16: 1000 mg via ORAL
  Filled 2018-11-16: qty 2

## 2018-11-16 MED ORDER — FAMOTIDINE 20 MG PO TABS
20.0000 mg | ORAL_TABLET | Freq: Once | ORAL | Status: AC
  Administered 2018-11-16: 20 mg via ORAL

## 2018-11-16 MED ORDER — FENTANYL CITRATE (PF) 100 MCG/2ML IJ SOLN
INTRAMUSCULAR | Status: DC | PRN
  Administered 2018-11-16 (×9): 50 ug via INTRAVENOUS

## 2018-11-16 MED ORDER — DEXAMETHASONE SODIUM PHOSPHATE 10 MG/ML IJ SOLN
INTRAMUSCULAR | Status: DC | PRN
  Administered 2018-11-16: 10 mg via INTRAVENOUS

## 2018-11-16 MED ORDER — ROCURONIUM BROMIDE 50 MG/5ML IV SOLN
INTRAVENOUS | Status: AC
  Filled 2018-11-16: qty 1

## 2018-11-16 MED ORDER — ACETAMINOPHEN 500 MG PO TABS
1000.0000 mg | ORAL_TABLET | ORAL | Status: AC
  Administered 2018-11-16: 1000 mg via ORAL

## 2018-11-16 MED ORDER — PROPOFOL 10 MG/ML IV BOLUS
INTRAVENOUS | Status: AC
  Filled 2018-11-16: qty 40

## 2018-11-16 MED ORDER — DEXMEDETOMIDINE HCL IN NACL 80 MCG/20ML IV SOLN
INTRAVENOUS | Status: AC
  Filled 2018-11-16: qty 20

## 2018-11-16 MED ORDER — SODIUM CHLORIDE (PF) 0.9 % IJ SOLN
INTRAMUSCULAR | Status: AC
  Filled 2018-11-16: qty 50

## 2018-11-16 MED ORDER — HEPARIN SODIUM (PORCINE) 5000 UNIT/ML IJ SOLN
5000.0000 [IU] | Freq: Once | INTRAMUSCULAR | Status: AC
  Administered 2018-11-16: 5000 [IU] via SUBCUTANEOUS

## 2018-11-16 MED ORDER — ACETAMINOPHEN 500 MG PO TABS
1000.0000 mg | ORAL_TABLET | Freq: Four times a day (QID) | ORAL | Status: DC
  Administered 2018-11-16 – 2018-11-18 (×7): 1000 mg via ORAL
  Filled 2018-11-16 (×9): qty 2

## 2018-11-16 MED ORDER — BUPIVACAINE-EPINEPHRINE 0.25% -1:200000 IJ SOLN
INTRAMUSCULAR | Status: DC | PRN
  Administered 2018-11-16: 30 mL

## 2018-11-16 MED ORDER — INFLUENZA VAC SPLIT QUAD 0.5 ML IM SUSY
0.5000 mL | PREFILLED_SYRINGE | INTRAMUSCULAR | Status: AC
  Administered 2018-11-18: 0.5 mL via INTRAMUSCULAR
  Filled 2018-11-16: qty 0.5

## 2018-11-16 MED ORDER — ONDANSETRON HCL 4 MG/2ML IJ SOLN
4.0000 mg | Freq: Four times a day (QID) | INTRAMUSCULAR | Status: DC | PRN
  Administered 2018-11-16: 4 mg via INTRAVENOUS
  Filled 2018-11-16: qty 2

## 2018-11-16 MED ORDER — ADULT MULTIVITAMIN W/MINERALS CH
1.0000 | ORAL_TABLET | Freq: Every day | ORAL | Status: DC
  Administered 2018-11-16 – 2018-11-18 (×3): 1 via ORAL
  Filled 2018-11-16 (×3): qty 1

## 2018-11-16 MED ORDER — ROCURONIUM BROMIDE 100 MG/10ML IV SOLN
INTRAVENOUS | Status: DC | PRN
  Administered 2018-11-16: 10 mg via INTRAVENOUS
  Administered 2018-11-16: 5 mg via INTRAVENOUS
  Administered 2018-11-16: 20 mg via INTRAVENOUS
  Administered 2018-11-16: 45 mg via INTRAVENOUS
  Administered 2018-11-16: 20 mg via INTRAVENOUS

## 2018-11-16 MED ORDER — LORAZEPAM 1 MG PO TABS: 1.0000 mg | ORAL_TABLET | Freq: Four times a day (QID) | ORAL | Status: DC | PRN

## 2018-11-16 MED ORDER — LACTATED RINGERS IV SOLN
INTRAVENOUS | Status: DC | PRN
  Administered 2018-11-16 (×3): via INTRAVENOUS

## 2018-11-16 MED ORDER — OXYCODONE HCL 5 MG PO TABS
5.0000 mg | ORAL_TABLET | ORAL | Status: DC | PRN
  Filled 2018-11-16: qty 2

## 2018-11-16 MED ORDER — FENTANYL CITRATE (PF) 100 MCG/2ML IJ SOLN
INTRAMUSCULAR | Status: AC
  Administered 2018-11-16: 25 ug via INTRAVENOUS
  Filled 2018-11-16: qty 2

## 2018-11-16 MED ORDER — FENTANYL CITRATE (PF) 100 MCG/2ML IJ SOLN
INTRAMUSCULAR | Status: AC
  Filled 2018-11-16: qty 2

## 2018-11-16 MED ORDER — BUPIVACAINE-EPINEPHRINE (PF) 0.25% -1:200000 IJ SOLN
INTRAMUSCULAR | Status: AC
  Filled 2018-11-16: qty 30

## 2018-11-16 MED ORDER — VITAMIN B-1 100 MG PO TABS
100.0000 mg | ORAL_TABLET | Freq: Every day | ORAL | Status: DC
  Administered 2018-11-16 – 2018-11-18 (×3): 100 mg via ORAL
  Filled 2018-11-16 (×4): qty 1

## 2018-11-16 MED ORDER — SUCCINYLCHOLINE CHLORIDE 20 MG/ML IJ SOLN
INTRAMUSCULAR | Status: DC | PRN
  Administered 2018-11-16: 120 mg via INTRAVENOUS

## 2018-11-16 MED ORDER — CHLORHEXIDINE GLUCONATE CLOTH 2 % EX PADS: 6.0000 | MEDICATED_PAD | Freq: Once | CUTANEOUS | Status: DC

## 2018-11-16 MED ORDER — PROCHLORPERAZINE EDISYLATE 10 MG/2ML IJ SOLN
5.0000 mg | Freq: Four times a day (QID) | INTRAMUSCULAR | Status: DC | PRN
  Filled 2018-11-16: qty 2

## 2018-11-16 MED ORDER — CELECOXIB 200 MG PO CAPS
200.0000 mg | ORAL_CAPSULE | ORAL | Status: AC
  Administered 2018-11-16: 200 mg via ORAL

## 2018-11-16 MED ORDER — DEXAMETHASONE SODIUM PHOSPHATE 10 MG/ML IJ SOLN
INTRAMUSCULAR | Status: AC
  Filled 2018-11-16: qty 1

## 2018-11-16 MED ORDER — FAMOTIDINE 20 MG PO TABS
ORAL_TABLET | ORAL | Status: AC
  Administered 2018-11-16: 20 mg via ORAL
  Filled 2018-11-16: qty 1

## 2018-11-16 MED ORDER — GLYCOPYRROLATE 0.2 MG/ML IJ SOLN
INTRAMUSCULAR | Status: AC
  Filled 2018-11-16: qty 1

## 2018-11-16 MED ORDER — HEPARIN SODIUM (PORCINE) 5000 UNIT/ML IJ SOLN
INTRAMUSCULAR | Status: AC
  Administered 2018-11-16: 5000 [IU] via SUBCUTANEOUS
  Filled 2018-11-16: qty 1

## 2018-11-16 MED ORDER — PHENYLEPHRINE HCL 10 MG/ML IJ SOLN
INTRAMUSCULAR | Status: AC
  Filled 2018-11-16: qty 1

## 2018-11-16 MED ORDER — ACETAMINOPHEN 10 MG/ML IV SOLN
INTRAVENOUS | Status: AC
  Filled 2018-11-16: qty 100

## 2018-11-16 MED ORDER — PROCHLORPERAZINE MALEATE 10 MG PO TABS
10.0000 mg | ORAL_TABLET | Freq: Four times a day (QID) | ORAL | Status: DC | PRN
  Filled 2018-11-16: qty 1

## 2018-11-16 MED ORDER — BUPIVACAINE LIPOSOME 1.3 % IJ SUSP
INTRAMUSCULAR | Status: DC | PRN
  Administered 2018-11-16: 20 mL

## 2018-11-16 MED ORDER — FENTANYL CITRATE (PF) 250 MCG/5ML IJ SOLN
INTRAMUSCULAR | Status: AC
  Filled 2018-11-16: qty 5

## 2018-11-16 MED ORDER — MIDAZOLAM HCL 2 MG/2ML IJ SOLN
INTRAMUSCULAR | Status: DC | PRN
  Administered 2018-11-16: 2 mg via INTRAVENOUS

## 2018-11-16 MED ORDER — KETOROLAC TROMETHAMINE 30 MG/ML IJ SOLN
30.0000 mg | Freq: Four times a day (QID) | INTRAMUSCULAR | Status: DC
  Administered 2018-11-16 – 2018-11-18 (×9): 30 mg via INTRAVENOUS
  Filled 2018-11-16 (×9): qty 1

## 2018-11-16 MED ORDER — LIDOCAINE HCL (PF) 2 % IJ SOLN
INTRAMUSCULAR | Status: AC
  Filled 2018-11-16: qty 10

## 2018-11-16 MED ORDER — DEXTROSE IN LACTATED RINGERS 5 % IV SOLN
INTRAVENOUS | Status: DC
  Administered 2018-11-16 – 2018-11-17 (×3): via INTRAVENOUS

## 2018-11-16 MED ORDER — CELECOXIB 200 MG PO CAPS
ORAL_CAPSULE | ORAL | Status: AC
  Administered 2018-11-16: 200 mg via ORAL
  Filled 2018-11-16: qty 1

## 2018-11-16 MED ORDER — SUGAMMADEX SODIUM 200 MG/2ML IV SOLN
INTRAVENOUS | Status: DC | PRN
  Administered 2018-11-16: 150 mg via INTRAVENOUS

## 2018-11-16 MED ORDER — ONDANSETRON HCL 4 MG/2ML IJ SOLN
INTRAMUSCULAR | Status: DC | PRN
  Administered 2018-11-16: 4 mg via INTRAVENOUS

## 2018-11-16 MED ORDER — GLYCOPYRROLATE 0.2 MG/ML IJ SOLN
INTRAMUSCULAR | Status: DC | PRN
  Administered 2018-11-16: 0.2 mg via INTRAVENOUS

## 2018-11-16 MED ORDER — GABAPENTIN 300 MG PO CAPS
300.0000 mg | ORAL_CAPSULE | ORAL | Status: AC
  Administered 2018-11-16: 300 mg via ORAL

## 2018-11-16 MED ORDER — THIAMINE HCL 100 MG/ML IJ SOLN
100.0000 mg | Freq: Every day | INTRAMUSCULAR | Status: DC
  Filled 2018-11-16: qty 2

## 2018-11-16 MED ORDER — HYDRALAZINE HCL 20 MG/ML IJ SOLN: 10.0000 mg | INTRAMUSCULAR | Status: DC | PRN

## 2018-11-16 MED ORDER — MORPHINE SULFATE (PF) 4 MG/ML IV SOLN
4.0000 mg | INTRAVENOUS | Status: DC | PRN
  Administered 2018-11-16 (×2): 4 mg via INTRAVENOUS
  Filled 2018-11-16 (×2): qty 1

## 2018-11-16 MED ORDER — GABAPENTIN 600 MG PO TABS
300.0000 mg | ORAL_TABLET | Freq: Three times a day (TID) | ORAL | Status: DC
  Administered 2018-11-16 – 2018-11-18 (×6): 300 mg via ORAL
  Filled 2018-11-16 (×6): qty 1

## 2018-11-16 MED ORDER — PHENYLEPHRINE HCL 10 MG/ML IJ SOLN
INTRAMUSCULAR | Status: DC | PRN
  Administered 2018-11-16: 50 ug via INTRAVENOUS
  Administered 2018-11-16 (×2): 100 ug via INTRAVENOUS
  Administered 2018-11-16: 50 ug via INTRAVENOUS
  Administered 2018-11-16: 100 ug via INTRAVENOUS
  Administered 2018-11-16: 50 ug via INTRAVENOUS
  Administered 2018-11-16 (×2): 100 ug via INTRAVENOUS
  Administered 2018-11-16: 200 ug via INTRAVENOUS

## 2018-11-16 MED ORDER — SUCCINYLCHOLINE CHLORIDE 20 MG/ML IJ SOLN
INTRAMUSCULAR | Status: AC
  Filled 2018-11-16: qty 1

## 2018-11-16 MED ORDER — PROPOFOL 10 MG/ML IV BOLUS
INTRAVENOUS | Status: DC | PRN
  Administered 2018-11-16: 180 mg via INTRAVENOUS

## 2018-11-16 MED ORDER — ONDANSETRON HCL 4 MG/2ML IJ SOLN: 4.0000 mg | Freq: Once | INTRAMUSCULAR | Status: DC | PRN

## 2018-11-16 MED ORDER — FOLIC ACID 1 MG PO TABS
1.0000 mg | ORAL_TABLET | Freq: Every day | ORAL | Status: DC
  Administered 2018-11-16 – 2018-11-18 (×3): 1 mg via ORAL
  Filled 2018-11-16 (×3): qty 1

## 2018-11-16 MED ORDER — DEXMEDETOMIDINE HCL 200 MCG/2ML IV SOLN
INTRAVENOUS | Status: DC | PRN
  Administered 2018-11-16 (×2): 8 ug via INTRAVENOUS

## 2018-11-16 MED ORDER — LORAZEPAM 2 MG/ML IJ SOLN: 1.0000 mg | Freq: Four times a day (QID) | INTRAMUSCULAR | Status: DC | PRN

## 2018-11-16 MED ORDER — BUPIVACAINE HCL (PF) 0.5 % IJ SOLN
INTRAMUSCULAR | Status: AC
  Filled 2018-11-16: qty 30

## 2018-11-16 MED ORDER — ONDANSETRON HCL 4 MG/2ML IJ SOLN
INTRAMUSCULAR | Status: AC
  Filled 2018-11-16: qty 2

## 2018-11-16 MED ORDER — BUPIVACAINE LIPOSOME 1.3 % IJ SUSP
INTRAMUSCULAR | Status: AC
  Filled 2018-11-16: qty 20

## 2018-11-16 MED ORDER — KETOROLAC TROMETHAMINE 30 MG/ML IJ SOLN: 30.0000 mg | Freq: Four times a day (QID) | INTRAMUSCULAR | Status: DC | PRN

## 2018-11-16 SURGICAL SUPPLY — 78 items
"PENCIL ELECTRO HAND CTR " (MISCELLANEOUS) ×1 IMPLANT
APPLIER CLIP 13 LRG OPEN (CLIP)
BAG DECANTER FOR FLEXI CONT (MISCELLANEOUS) ×1 IMPLANT
BLADE CLIPPER SURG (BLADE) ×3 IMPLANT
BLADE SURG 15 STRL LF DISP TIS (BLADE) ×1 IMPLANT
BLADE SURG 15 STRL SS (BLADE) ×2
BLADE SURG SZ10 CARB STEEL (BLADE) ×1 IMPLANT
BLADE SURG SZ11 CARB STEEL (BLADE) ×3 IMPLANT
BULB RESERV EVAC DRAIN JP 100C (MISCELLANEOUS) ×1 IMPLANT
CANISTER SUCT 1200ML W/VALVE (MISCELLANEOUS) ×3 IMPLANT
CATH ROBINSON RED 14FR (CATHETERS) ×1 IMPLANT
CATH URET ROBINSON RED 14FR (CATHETERS) ×2
CHLORAPREP W/TINT 26ML (MISCELLANEOUS) ×3 IMPLANT
CLIP APPLIE 13 LRG OPEN (CLIP) ×1 IMPLANT
COVER WAND RF STERILE (DRAPES) ×3 IMPLANT
DEFOGGER SCOPE WARMER CLEARIFY (MISCELLANEOUS) ×3 IMPLANT
DERMABOND ADVANCED (GAUZE/BANDAGES/DRESSINGS) ×4
DERMABOND ADVANCED .7 DNX12 (GAUZE/BANDAGES/DRESSINGS) ×2 IMPLANT
DRAPE INCISE IOBAN 66X45 STRL (DRAPES) ×3 IMPLANT
DRAPE LEGGINS SURG 28X43 STRL (DRAPES) ×3 IMPLANT
DRAPE UNDER BUTTOCK W/FLU (DRAPES) ×3 IMPLANT
ELECT BLADE 6.5 EXT (BLADE) ×3 IMPLANT
ELECT REM PT RETURN 9FT ADLT (ELECTROSURGICAL) ×3
ELECTRODE REM PT RTRN 9FT ADLT (ELECTROSURGICAL) ×1 IMPLANT
GAUZE SPONGE 4X4 12PLY STRL (GAUZE/BANDAGES/DRESSINGS) ×3 IMPLANT
GLOVE BIO SURGEON STRL SZ7 (GLOVE) ×6 IMPLANT
GOWN STRL REUS W/TWL LRG LVL4 (GOWN DISPOSABLE) ×14 IMPLANT
HANDLE SUCTION POOLE (INSTRUMENTS) ×1 IMPLANT
HANDLE YANKAUER SUCT BULB TIP (MISCELLANEOUS) ×3 IMPLANT
IRRIGATION STRYKERFLOW (MISCELLANEOUS) ×1 IMPLANT
IRRIGATOR STRYKERFLOW (MISCELLANEOUS) ×3
IV NS 1000ML (IV SOLUTION) ×2
IV NS 1000ML BAXH (IV SOLUTION) ×1 IMPLANT
JACKSON PRATT 10 (INSTRUMENTS) ×1 IMPLANT
L-HOOK LAP DISP 36CM (ELECTROSURGICAL)
LHOOK LAP DISP 36CM (ELECTROSURGICAL) ×1 IMPLANT
MARKER SKIN DUAL TIP RULER LAB (MISCELLANEOUS) ×3 IMPLANT
NEEDLE HYPO 22GX1.5 SAFETY (NEEDLE) ×3 IMPLANT
NS IRRIG 1000ML POUR BTL (IV SOLUTION) ×3 IMPLANT
PACK COLON CLEAN CLOSURE (MISCELLANEOUS) ×3 IMPLANT
PACK LAP CHOLECYSTECTOMY (MISCELLANEOUS) ×3 IMPLANT
PENCIL ELECTRO HAND CTR (MISCELLANEOUS) ×3 IMPLANT
RELOAD STAPLE 60 2.6 WHT THN (STAPLE) IMPLANT
RELOAD STAPLE 60 3.6 BLU REG (STAPLE) ×1 IMPLANT
RELOAD STAPLER BLUE 60MM (STAPLE) ×3 IMPLANT
RELOAD STAPLER WHITE 60MM (STAPLE) ×1 IMPLANT
SCISSORS METZENBAUM CVD 33 (INSTRUMENTS) ×1 IMPLANT
SET TUBE SMOKE EVAC HIGH FLOW (TUBING) ×3 IMPLANT
SHEARS HARMONIC ACE PLUS 36CM (ENDOMECHANICALS) ×3 IMPLANT
SLEEVE ENDOPATH XCEL 5M (ENDOMECHANICALS) ×3 IMPLANT
SPONGE LAP 18X18 RF (DISPOSABLE) ×5 IMPLANT
STAPLE ECHEON FLEX 60 POW ENDO (STAPLE) ×2 IMPLANT
STAPLER CIRCULAR 29MM (STAPLE) IMPLANT
STAPLER ENDO ILS CVD 18 33 (STAPLE) IMPLANT
STAPLER PROX 25M (MISCELLANEOUS) ×2 IMPLANT
STAPLER RELOAD BLUE 60MM (STAPLE) ×9
STAPLER RELOAD WHITE 60MM (STAPLE) ×3
STAPLER SKIN PROX 35W (STAPLE) ×1 IMPLANT
SUCT SIGMOIDOSCOPE TIP 18 W/TU (SUCTIONS) ×1 IMPLANT
SUCTION POOLE HANDLE (INSTRUMENTS) ×3
SUT MNCRL AB 4-0 PS2 18 (SUTURE) ×4 IMPLANT
SUT PDS AB 0 CT1 27 (SUTURE) ×4 IMPLANT
SUT SILK 2 0 (SUTURE) ×2
SUT SILK 2 0SH CR/8 30 (SUTURE) ×3 IMPLANT
SUT SILK 2-0 (SUTURE) ×3 IMPLANT
SUT SILK 2-0 18XBRD TIE 12 (SUTURE) ×1 IMPLANT
SUT SILK 3-0 (SUTURE) ×3 IMPLANT
SUT VIC AB 2-0 SH 27 (SUTURE) ×4
SUT VIC AB 2-0 SH 27XBRD (SUTURE) ×2 IMPLANT
SYR 20CC LL (SYRINGE) ×6 IMPLANT
SYR 50ML LL SCALE MARK (SYRINGE) ×3 IMPLANT
SYRINGE IRR TOOMEY STRL 70CC (SYRINGE) ×1 IMPLANT
SYS LAPSCP GELPORT 120MM (MISCELLANEOUS) ×3
SYSTEM LAPSCP GELPORT 120MM (MISCELLANEOUS) ×1 IMPLANT
TOWEL OR 17X26 4PK STRL BLUE (TOWEL DISPOSABLE) ×1 IMPLANT
TRAY FOLEY MTR SLVR 16FR STAT (SET/KITS/TRAYS/PACK) ×3 IMPLANT
TROCAR XCEL 12X100 BLDLESS (ENDOMECHANICALS) ×3 IMPLANT
TROCAR XCEL NON-BLD 5MMX100MML (ENDOMECHANICALS) ×3 IMPLANT

## 2018-11-16 NOTE — Transfer of Care (Signed)
Immediate Anesthesia Transfer of Care Note  Patient: Karl Richardson  Procedure(s) Performed: LAPAROSCOPIC SIGMOID COLECTOMY (N/A )  Patient Location: PACU  Anesthesia Type:General  Level of Consciousness: awake  Airway & Oxygen Therapy: Patient Spontanous Breathing and Patient connected to face mask oxygen  Post-op Assessment: Report given to RN and Post -op Vital signs reviewed and stable  Post vital signs: stable  Last Vitals:  Vitals Value Taken Time  BP 153/92 11/16/2018 12:30 PM  Temp 36.4 C 11/16/2018 12:30 PM  Pulse 89 11/16/2018 12:36 PM  Resp 11 11/16/2018 12:36 PM  SpO2 96 % 11/16/2018 12:36 PM  Vitals shown include unvalidated device data.  Last Pain:  Vitals:   11/16/18 0610  TempSrc: Tympanic  PainSc: 0-No pain         Complications: No apparent anesthesia complications

## 2018-11-16 NOTE — Op Note (Signed)
PROCEDURES: 1. Laparoscopic lysis of adhesions 2. Laparoscopic low anterior resection 3. Laparoscopic takedown of splenic flexure   Pre-operative Diagnosis: Chronic diverticulitis w Hx hx of perforation  Post-operative Diagnosis: same  Surgeon: Merri Rayiego F Yusuf Yu   Assistants: Dr. Earlene Plateravis Required for exposure and to safely perform anastomosis  Anesthesia: General endotracheal anesthesia  ASA Class: 2   Surgeon: Sterling Bigiego Antoria Lanza , MD FACS  Anesthesia: Gen. with endotracheal tube  Findings: Diverticulitis with adhesions from the sigmoid to the pelvic wall Tension free anastomosis , well perfused and negative leak test  Estimated Blood Loss: 20cc         Drains: none         Specimens: colon          Complications: none                Condition: stable  Procedure Details  The patient was seen again in the Holding Room. The benefits, complications, treatment options, and expected outcomes were discussed with the patient. The risks of bleeding, infection, recurrence of symptoms, failure to resolve symptoms,  bowel injury, any of which could require further surgery were reviewed with the patient.   The patient was taken to Operating Room, identified as Karl Richardson and the procedure verified.  A Time Out was held and the above information confirmed.  Prior to the induction of general anesthesia, antibiotic prophylaxis was administered. VTE prophylaxis was in place. General endotracheal anesthesia was then administered and tolerated well. After the induction, the abdomen was prepped with Chloraprep and draped in the sterile fashion. The patient was positioned in the supine position. Prior to the induction of general anesthesia, antibiotic prophylaxis was administered. VTE prophylaxis was in place. General endotracheal anesthesia was then administered and tolerated well. After the induction, the abdomen was prepped with Chloraprep and draped in the sterile fashion. The patient was positioned  in lithotomy position. 7 cm incision was created as a midline mini laparotomy. The abdominal cavity was entered under direct visualization and the GelPort device was placed. A 5 mm port was placed in the suprapubic area under direct visualization and pneumoperitoneum was obtained. There were dense adhesions from the omentum to the abdominal wall that where lysed in the standard fashion with the Harmonic scalpel. We also were able to place a 12 mm port in the right lower quadrant and a 5 mm port in the left lower quadrant under direct visualization. There was significant adhesive disease in the pelvis from the sigmoid to the pelvic wall  These adhesions were lysed with a combination of finger fracturing and Harmonic scalpel. The white line of pot was identified and divided and we mobilized the descending colon IN a lateral to medial fashion. We preserved the ureter at all times. We were also able to mobilize the splenic flexure using Harmonic scalpel in the standard fashion. We identified the takeoff of the inferior mesenteric artery dissected the pedicle and divided using a 60 mm vascular echelon stapler in the standard fashion. Using the Harmonic's scalpel were able to divide the mesorectum and and also divided proximal to the mesentery of the descending colon. Once we have an adequate visualization and mobilization we divided the sigmoid colon distally passed the junction of the rectosigmoid area with multiple blue loads using the echelon stapler. We did have to mobilize the peritoneal reflection at the pelvis to obtain a good distal margin. We removed the GelPort and visualized the colon in a direct fashion. An divided at the mid  descending colon with standard 60 mm blue load. We opened the stopped and measure the diameter of the bowel. A 25 mm dilator was perfect size. A pursestring was used after in certain the anvil device. Dr. Earlene Plater was able to pass a 25 mm standard EEA stapler device through the anus .  Under direct visualization we perform an end to end anastomosis with the EEA device. A leak test was performed inflating the colon with a Toomey syringe and a rubber catheter. No evidence of leak was observed. There was also adequate hemostasis.  All the laparoscopic ports were removed and a second look showed no evidence of any bleeding or any other injuries. We changed gloves and place a new tray to close the abdomen with a 0 PDS suture in a running fashion and the skin was closed with 4-0 Monocryl. Liposomal Marcaine was injected on all incision sites under direct visualization. Dermabond was used to coat all the skin incisions. Needle and laparotomy count were correct and there were no immediate occasions  Sterling Big, MD, FACS

## 2018-11-16 NOTE — Anesthesia Post-op Follow-up Note (Signed)
Anesthesia QCDR form completed.        

## 2018-11-16 NOTE — Interval H&P Note (Signed)
History and Physical Interval Note:  11/16/2018 7:22 AM  Karl Richardson  has presented today for surgery, with the diagnosis of DIVERTICULITIS  The various methods of treatment have been discussed with the patient and family. After consideration of risks, benefits and other options for treatment, the patient has consented to  Procedure(s): LAPAROSCOPIC SIGMOID COLECTOMY (N/A) as a surgical intervention .  The patient's history has been reviewed, patient examined, no change in status, stable for surgery.  I have reviewed the patient's chart and labs.  Questions were answered to the patient's satisfaction.     Antonisha Waskey F Shacarra Choe

## 2018-11-16 NOTE — Anesthesia Procedure Notes (Signed)
Procedure Name: Intubation Date/Time: 11/16/2018 8:51 AM Performed by: Lavone Orn, CRNA Pre-anesthesia Checklist: Patient identified, Emergency Drugs available, Suction available, Patient being monitored and Timeout performed Patient Re-evaluated:Patient Re-evaluated prior to induction Oxygen Delivery Method: Circle system utilized Preoxygenation: Pre-oxygenation with 100% oxygen Induction Type: IV induction Ventilation: Mask ventilation without difficulty Laryngoscope Size: Mac and 4 Grade View: Grade I Tube type: Oral Number of attempts: 1 Airway Equipment and Method: Stylet Placement Confirmation: ETT inserted through vocal cords under direct vision,  positive ETCO2 and breath sounds checked- equal and bilateral Secured at: 22 cm Tube secured with: Tape Dental Injury: Teeth and Oropharynx as per pre-operative assessment

## 2018-11-16 NOTE — Anesthesia Postprocedure Evaluation (Signed)
Anesthesia Post Note  Patient: Karl Richardson  Procedure(s) Performed: LAPAROSCOPIC SIGMOID COLECTOMY (N/A )  Patient location during evaluation: PACU Anesthesia Type: General Level of consciousness: awake and alert Pain management: pain level controlled Vital Signs Assessment: post-procedure vital signs reviewed and stable Respiratory status: spontaneous breathing and respiratory function stable Cardiovascular status: stable Anesthetic complications: no     Last Vitals:  Vitals:   11/16/18 1230 11/16/18 1246  BP: (!) 153/92 (!) 146/75  Pulse: (!) 105 86  Resp: 16 14  Temp: 36.4 C   SpO2: 100% 99%    Last Pain:  Vitals:   11/16/18 0610  TempSrc: Tympanic  PainSc: 0-No pain                 Cody Albus K

## 2018-11-16 NOTE — Addendum Note (Signed)
Addendum  created 11/16/18 1420 by Oletta Lamas, CRNA   Charge Capture section accepted

## 2018-11-16 NOTE — Anesthesia Preprocedure Evaluation (Addendum)
Anesthesia Evaluation  Patient identified by MRN, date of birth, ID band Patient awake    Reviewed: Allergy & Precautions, NPO status , Patient's Chart, lab work & pertinent test results  History of Anesthesia Complications Negative for: history of anesthetic complications  Airway Mallampati: II       Dental  (+) Loose, Chipped   Pulmonary neg sleep apnea, neg COPD,           Cardiovascular hypertension (Pt denies diagnosis), (-) Past MI and (-) CHF (-) dysrhythmias (-) Valvular Problems/Murmurs     Neuro/Psych neg Seizures    GI/Hepatic Neg liver ROS, neg GERD  ,  Endo/Other  neg diabetes  Renal/GU negative Renal ROS     Musculoskeletal   Abdominal   Peds  Hematology   Anesthesia Other Findings   Reproductive/Obstetrics                            Anesthesia Physical Anesthesia Plan  ASA: II  Anesthesia Plan: General   Post-op Pain Management:    Induction: Intravenous  PONV Risk Score and Plan: 2 and Dexamethasone and Ondansetron  Airway Management Planned: Oral ETT  Additional Equipment:   Intra-op Plan:   Post-operative Plan:   Informed Consent: I have reviewed the patients History and Physical, chart, labs and discussed the procedure including the risks, benefits and alternatives for the proposed anesthesia with the patient or authorized representative who has indicated his/her understanding and acceptance.       Plan Discussed with:   Anesthesia Plan Comments: (Pt with urine drug screen unable to get results. Pt denies use of anything but marijuana. No other tests available to get this information. Pt and surgeon aware of risks if pt has used cocaine in the last three days and desire to proceed. )       Anesthesia Quick Evaluation

## 2018-11-17 ENCOUNTER — Encounter: Payer: Self-pay | Admitting: Surgery

## 2018-11-17 LAB — CBC
HEMATOCRIT: 39.4 % (ref 39.0–52.0)
Hemoglobin: 13.6 g/dL (ref 13.0–17.0)
MCH: 31.9 pg (ref 26.0–34.0)
MCHC: 34.5 g/dL (ref 30.0–36.0)
MCV: 92.3 fL (ref 80.0–100.0)
Platelets: 264 10*3/uL (ref 150–400)
RBC: 4.27 MIL/uL (ref 4.22–5.81)
RDW: 11.6 % (ref 11.5–15.5)
WBC: 16.7 10*3/uL — ABNORMAL HIGH (ref 4.0–10.5)
nRBC: 0 % (ref 0.0–0.2)

## 2018-11-17 LAB — BASIC METABOLIC PANEL
Anion gap: 8 (ref 5–15)
BUN: 7 mg/dL (ref 6–20)
CO2: 24 mmol/L (ref 22–32)
Calcium: 8.5 mg/dL — ABNORMAL LOW (ref 8.9–10.3)
Chloride: 105 mmol/L (ref 98–111)
Creatinine, Ser: 0.64 mg/dL (ref 0.61–1.24)
GFR calc Af Amer: >60 mL/min (ref >60)
GFR calc non Af Amer: >60 mL/min (ref >60)
GLUCOSE: 146 mg/dL — AB (ref 70–99)
Potassium: 3.6 mmol/L (ref 3.5–5.1)
Sodium: 137 mmol/L (ref 135–145)

## 2018-11-17 LAB — MAGNESIUM: Magnesium: 1.7 mg/dL (ref 1.7–2.4)

## 2018-11-17 LAB — HIV ANTIBODY (ROUTINE TESTING W REFLEX): HIV Screen 4th Generation wRfx: NONREACTIVE

## 2018-11-17 LAB — PHOSPHORUS: Phosphorus: 1.9 mg/dL — ABNORMAL LOW (ref 2.5–4.6)

## 2018-11-17 MED ORDER — SODIUM PHOSPHATES 45 MMOLE/15ML IV SOLN
10.0000 mmol | Freq: Once | INTRAVENOUS | Status: AC
  Administered 2018-11-17: 10 mmol via INTRAVENOUS
  Filled 2018-11-17: qty 3.33

## 2018-11-17 NOTE — Progress Notes (Signed)
[facility] SURGICAL PROGRESS NOTE  Hospital Day(s): 1.   Post op day(s): 1 Day Post-Op.   Interval History: Patient seen and examined, no acute events or new complaints overnight. Patient reports that he has some abdominal soreness but otherwise doing very well. No complaints of fever, chills, nausea, or emesis. He has tolerated clear liquids without difficulty. Endorses passing flatus and BM.   Review of Systems:  Constitutional: denies fever, chills  Gastrointestinal: denies abdominal pain, N/V, or diarrhea/and bowel function as per interval history Integumentary: denies any other rashes or skin discolorations except surgical incisions   Vital signs in last 24 hours: [min-max] current  Temp:  [97.4 F (36.3 C)-98.8 F (37.1 C)] 98.8 F (37.1 C) (02/07 0514) Pulse Rate:  [84-105] 84 (02/07 0514) Resp:  [9-22] 20 (02/07 0514) BP: (138-160)/(75-92) 148/77 (02/07 0514) SpO2:  [94 %-100 %] 100 % (02/07 0514) Weight:  [68.3 kg] 68.3 kg (02/06 1500)     Height: 5\' 5"  (165.1 cm) Weight: 68.3 kg BMI (Calculated): 25.06    Physical Exam:  Constitutional: alert, cooperative and no distress  Respiratory: breathing non-labored at rest  Cardiovascular: regular rate and sinus rhythm  Gastrointestinal: soft, non-tender, and non-distended Integumentary: Laparoscopic incisions are CDI, no erythema or drainage   Labs:  CBC Latest Ref Rng & Units 11/17/2018 11/16/2018 10/23/2018  WBC 4.0 - 10.5 K/uL 16.7(H) 19.3(H) 7.1  Hemoglobin 13.0 - 17.0 g/dL 54.4 92.0 10.0  Hematocrit 39.0 - 52.0 % 39.4 42.1 43.5  Platelets 150 - 400 K/uL 264 260 277   CMP Latest Ref Rng & Units 11/17/2018 11/16/2018 10/23/2018  Glucose 70 - 99 mg/dL 712(R) - 975(O)  BUN 6 - 20 mg/dL 7 - 10  Creatinine 8.32 - 1.24 mg/dL 5.49 8.26 4.15  Sodium 135 - 145 mmol/L 137 - 139  Potassium 3.5 - 5.1 mmol/L 3.6 - 4.1  Chloride 98 - 111 mmol/L 105 - 106  CO2 22 - 32 mmol/L 24 - 27  Calcium 8.9 - 10.3 mg/dL 8.3(E) -  9.0  Total Protein 6.5 - 8.1 g/dL - - 6.4(L)  Total Bilirubin 0.3 - 1.2 mg/dL - - 1.1  Alkaline Phos 38 - 126 U/L - - 48  AST 15 - 41 U/L - - 21  ALT 0 - 44 U/L - - 32    Imaging studies: No new pertinent imaging studies   Assessment/Plan:  57 y.o. male with improved leukocytosis but now with hypophosphatemia otherwise doing well 1 Day Post-Op s/p laparoscopic sigmoid colectomy with EEA for chronic diverticulitis with perforation history   - Advance to full liquids, discontinue IVF  - Pain control prn (minimize narcotics)  - Continue to monitor abdominal examination and ongoing bowel function  - Reoplete phosphorus, monitor   - Continue CIWA  - mobilization encouraged  - Medical management of comrbidities     - Discharge planning: Advance diet in AM, if tolerating, hopefully home tomorrow with f/u in 1 week  All of the above findings and recommendations were discussed with the patient, and the medical team, and all of patient's questions were answered to *his expressed satisfaction.  -- Lynden Oxford, PA-C [facility] 11/17/2018, 7:18 AM [phone] M-F: 7am - 4pm

## 2018-11-18 MED ORDER — OXYCODONE HCL 5 MG PO TABS: 5.0000 mg | ORAL_TABLET | ORAL | 0 refills | Status: AC | PRN

## 2018-11-18 MED ORDER — IBUPROFEN 600 MG PO TABS: 600.0000 mg | ORAL_TABLET | Freq: Three times a day (TID) | ORAL | 0 refills | Status: AC | PRN

## 2018-11-18 NOTE — Progress Notes (Signed)
11/18/2018  Subjective: Patient is 2 Days Post-Op s/p laparoscopic sigmoidectomy.  No acute events.  Patient advanced to soft diet this morning which he's tolerating well.  Denies any significant pain, only some soreness at the incisions.  Having bowel movements.  Vital signs: Temp:  [97.8 F (36.6 C)-98.4 F (36.9 C)] 97.8 F (36.6 C) (02/08 0405) Pulse Rate:  [75-77] 77 (02/08 0405) Resp:  [20] 20 (02/08 0405) BP: (151-155)/(82-87) 151/87 (02/08 0405) SpO2:  [98 %-99 %] 98 % (02/08 0405)   Intake/Output: 02/07 0701 - 02/08 0700 In: 547.5 [I.V.:547.5] Out: 1555 [Urine:1555] Last BM Date: 11/17/18  Physical Exam: Constitutional: No acute distress Abdomen:  Soft, nondistended, appropriately tender to palpation.  Incisions are clean, dry, intact without any evidence of infection.    Labs:  Recent Labs    11/16/18 1428 11/17/18 0342  WBC 19.3* 16.7*  HGB 14.3 13.6  HCT 42.1 39.4  PLT 260 264   Recent Labs    11/16/18 1428 11/17/18 0342  NA  --  137  K  --  3.6  CL  --  105  CO2  --  24  GLUCOSE  --  146*  BUN  --  7  CREATININE 0.83 0.64  CALCIUM  --  8.5*   No results for input(s): LABPROT, INR in the last 72 hours.  Imaging: No results found.  Assessment/Plan: This is a 58 y.o. male s/p laparoscopic sigmoidectomy  --Continue soft diet today. --Discussed with the patient that if after lunch he was doing well, he could potentially be discharged home.   Howie Ill, MD [facility]

## 2018-11-18 NOTE — Progress Notes (Signed)
Discharge order received. Patient is alert and oriented. Vital signs stable . No signs of acute distress. Discharge instructions given. Patient verbalized understanding. No other issues noted at this time.   

## 2018-11-18 NOTE — Discharge Summary (Signed)
Patient ID: Karl Richardson MRN: [id number] DOB/AGE: [birth year] 58 y.o.  Admit date: 11/16/2018 Discharge date: 11/18/2018   Discharge Diagnoses:  Active Problems:   Diverticulitis of colon   Procedures: Laparoscopic sigmoidectomy  Hospital Course: Patient was admitted on 11/16/2018 for an elective procedure for a laparoscopic sigmoidectomy.  He tolerated the procedure very well.  His Foley catheter was removed, his diet was slowly advanced when he regained bowel function, and his pain was well controlled.  His incisions were clean dry and intact with no complications from surgery.  He was deemed ready for discharge to home.  Consults: None  Disposition: Discharge disposition: 01-Home or Self Care       Discharge Instructions    Call MD for:  difficulty breathing, headache or visual disturbances   Complete by:  As directed    Call MD for:  persistant nausea and vomiting   Complete by:  As directed    Call MD for:  redness, tenderness, or signs of infection (pain, swelling, redness, odor or green/yellow discharge around incision site)   Complete by:  As directed    Call MD for:  severe uncontrolled pain   Complete by:  As directed    Call MD for:  temperature >100.4   Complete by:  As directed    Diet - low sodium heart healthy   Complete by:  As directed    Discharge instructions   Complete by:  As directed    1.  Patient may shower, but do not scrub wounds heavily and dab dry only. 2.  Do not submerge wounds in pool/tub for 1 week. 3.  Do not apply ointments or hydrogen peroxide to the wounds. 4.  May take Miralax once daily for constipation.   Driving Restrictions   Complete by:  As directed    Do not drive while taking narcotics for pain control.   Increase activity slowly   Complete by:  As directed    Lifting restrictions   Complete by:  As directed    No heavy lifting or pushing of more than 10-15 lbs for 4 weeks.   No dressing needed   Complete by:  As directed      Allergies as of 11/18/2018   No Known Allergies     Medication List    TAKE these medications   acetaminophen 500 MG tablet Commonly known as:  TYLENOL Take 500-1,000 mg by mouth every 6 (six) hours as needed (pain).   ibuprofen 600 MG tablet Commonly known as:  ADVIL,MOTRIN Take 1 tablet (600 mg total) by mouth every 8 (eight) hours as needed for fever, headache or mild pain.   multivitamin with minerals Tabs tablet Take 1 tablet by mouth daily.   oxyCODONE 5 MG immediate release tablet Commonly known as:  Oxy IR/ROXICODONE Take 1 tablet (5 mg total) by mouth every 4 (four) hours as needed for severe pain.      Follow-up Information    Pabon, [place] F, MD. Schedule an appointment as soon as possible for a visit in 1 week(s).   Specialty:  General Surgery Why:  s/p laparoscopic sigmoid colectomy Contact information: [address] [place] [address] [phone]

## 2018-11-20 LAB — SURGICAL PATHOLOGY

## 2018-11-21 ENCOUNTER — Telehealth: Payer: Self-pay

## 2018-11-21 NOTE — Telephone Encounter (Signed)
FMLA completed and faxed to [facility] [phone]. Patient notified and placed in scan folder.

## 2018-11-21 NOTE — Telephone Encounter (Signed)
Flagged on EMMI report for not having a follow up scheduled.  Called and spoke with patient.  He confirmed he now has an appointment with Dr. Everlene Farrier scheduled.  No other questions or issues noted.  I thanked him for his time and informed him he would receive one more automated call checking in over the next few days.

## 2018-11-27 ENCOUNTER — Ambulatory Visit (INDEPENDENT_AMBULATORY_CARE_PROVIDER_SITE_OTHER): Payer: BLUE CROSS/BLUE SHIELD | Admitting: Surgery

## 2018-11-27 ENCOUNTER — Encounter: Payer: Self-pay | Admitting: Surgery

## 2018-11-27 ENCOUNTER — Other Ambulatory Visit: Payer: Self-pay

## 2018-11-27 VITALS — BP 144/80 | HR 62 | Temp 98.1°F | Resp 14 | Ht 66.0 in | Wt 149.0 lb

## 2018-11-27 DIAGNOSIS — Z09 Encounter for follow-up examination after completed treatment for conditions other than malignant neoplasm: Secondary | ICD-10-CM

## 2018-11-27 NOTE — Progress Notes (Signed)
S/p sigmoid colectomy 2/6 Doing very well Taking PO No pain No fevers. + BM  PE NAD Abd: soft, nt, incision healing well, no infection  A/P Doing well No complications No heavy lifting RTC prn

## 2018-11-27 NOTE — Patient Instructions (Signed)
Patient is to return to the office as needed.  Call the office with any questions or concerns. 

## 2019-10-11 ENCOUNTER — Ambulatory Visit: Payer: BC Managed Care – PPO | Attending: Internal Medicine

## 2019-10-11 DIAGNOSIS — Z20822 Contact with and (suspected) exposure to covid-19: Secondary | ICD-10-CM

## 2019-10-13 ENCOUNTER — Telehealth: Payer: Self-pay

## 2019-10-13 NOTE — Telephone Encounter (Signed)
Pt called for covid results- advised that results are not back. Pt refused offer to get MyChart enrollment link.

## 2019-10-14 NOTE — Telephone Encounter (Signed)
Pt called back for covid results advised that results are not back.

## 2019-10-15 ENCOUNTER — Telehealth: Payer: Self-pay

## 2019-10-15 NOTE — Telephone Encounter (Signed)
Pt called for covid results- advised that result are not back.

## 2019-10-15 NOTE — Telephone Encounter (Signed)
Called to get test results. Results still pending.   Karl Richardson  

## 2019-10-17 ENCOUNTER — Telehealth: Payer: Self-pay

## 2019-10-17 ENCOUNTER — Ambulatory Visit: Payer: BC Managed Care – PPO | Attending: Internal Medicine

## 2019-10-17 DIAGNOSIS — Z20822 Contact with and (suspected) exposure to covid-19: Secondary | ICD-10-CM

## 2019-10-17 LAB — NOVEL CORONAVIRUS, NAA

## 2019-10-17 NOTE — Telephone Encounter (Signed)
Pt called to get covid test results from 12/31 @ [facility]. Explained that he would need to be retested.   Call was transferred to nurse to schedule an appointment.   Joycelyn Rua Hopkins

## 2019-10-18 LAB — NOVEL CORONAVIRUS, NAA: SARS-CoV-2, NAA: NOT DETECTED

## 2020-01-22 IMAGING — CT CT ABD-PELV W/ CM
2 of 5 series · 15 of 46 positions shown, 17 images · IV contrast (APPLIED)
Comparison: July 14, 2015

CLINICAL DATA: Elevated white blood cell count and abdominal pain

EXAM:
CT ABDOMEN AND PELVIS WITH CONTRAST
TECHNIQUE: Multidetector CT imaging of the abdomen and pelvis was performed
using the standard protocol following bolus administration of
intravenous contrast.
CONTRAST:  100mL 3G6WKB-TAA IOPAMIDOL (3G6WKB-TAA) INJECTION 61%

[Series 2: routine abd/pel with · axial · 0.70mm/px · z∈[-1036,-656]mm · 12 of 86 slices shown, 14 images]
[im 5/86  soft-tissue]
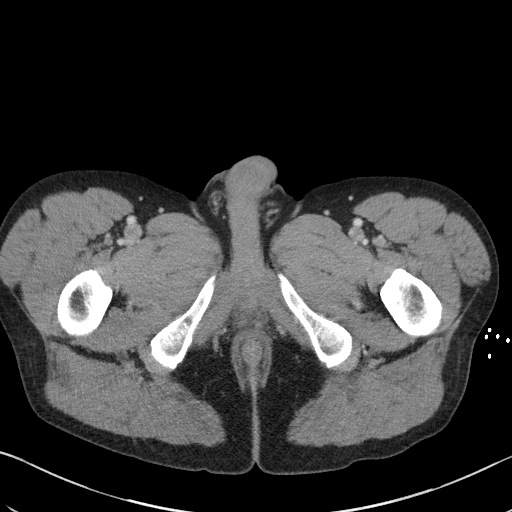
[im 5/86  bone]
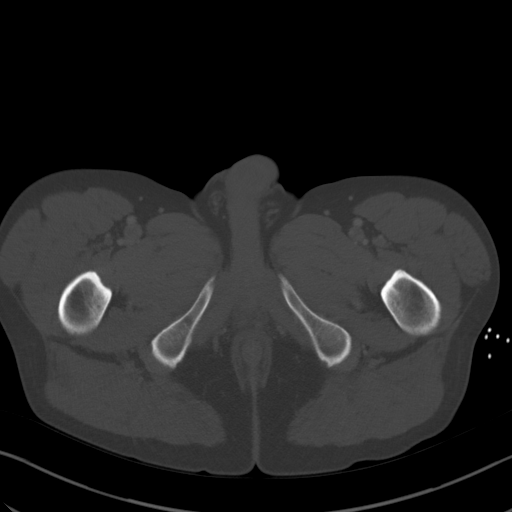
[im 15/86  soft-tissue]
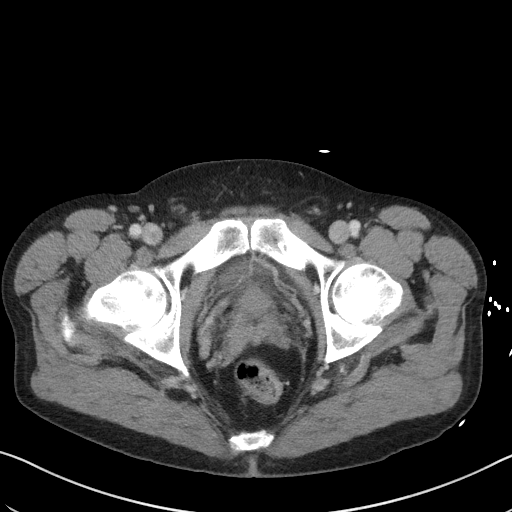
[im 19/86  soft-tissue]
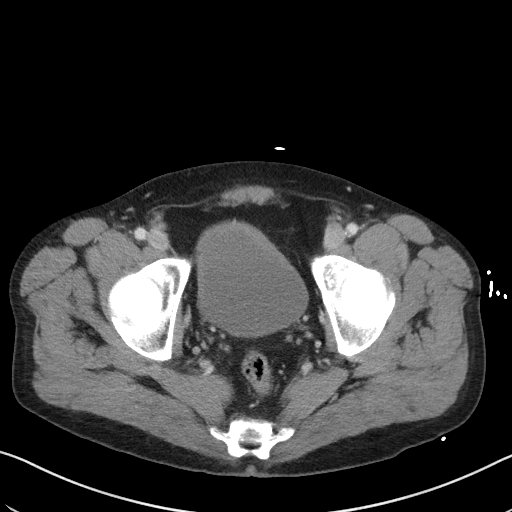
[im 24/86  soft-tissue]
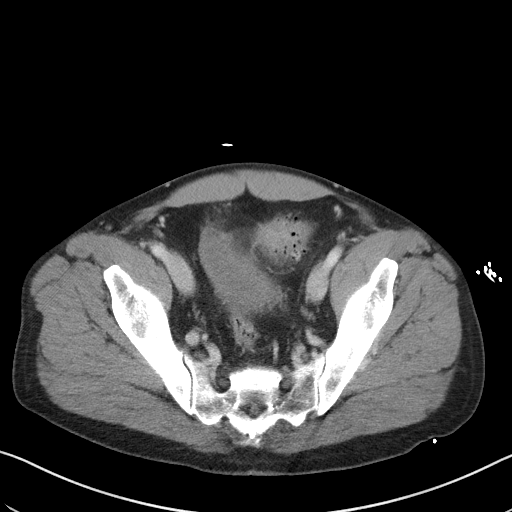
[im 34/86  soft-tissue]
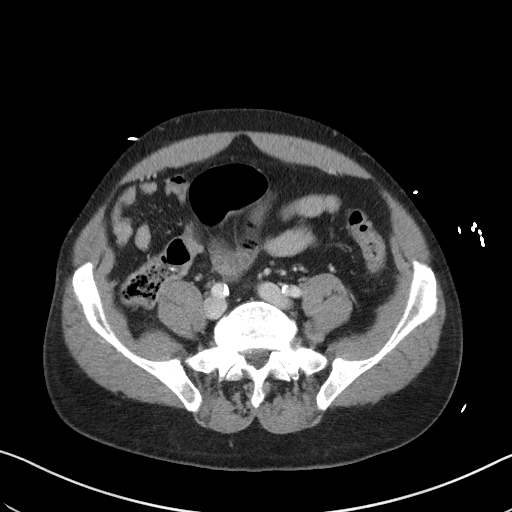
[im 38/86  soft-tissue]
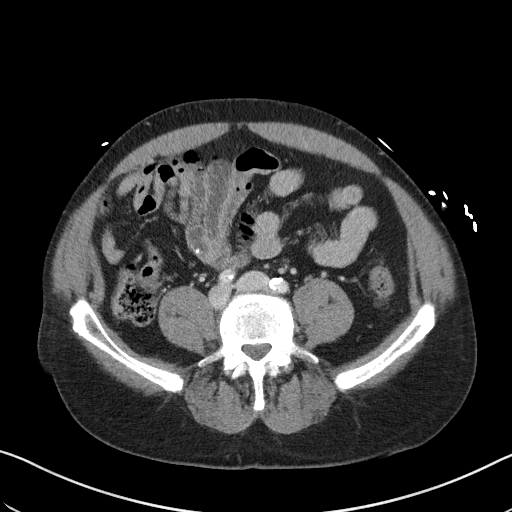
[im 48/86  soft-tissue]
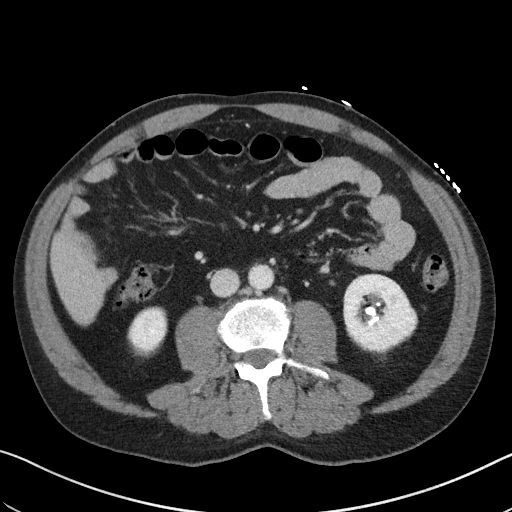
[im 52/86  soft-tissue]
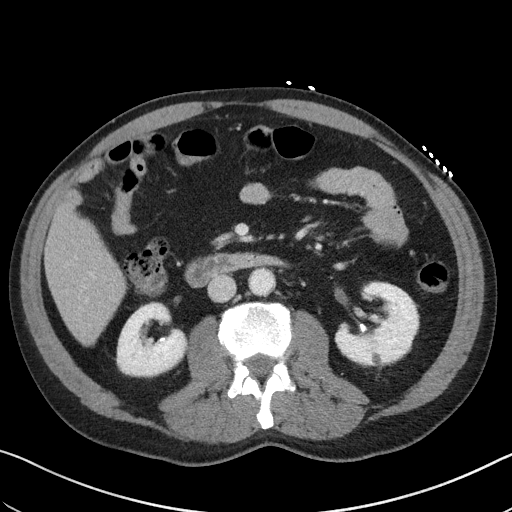
[im 62/86  soft-tissue]
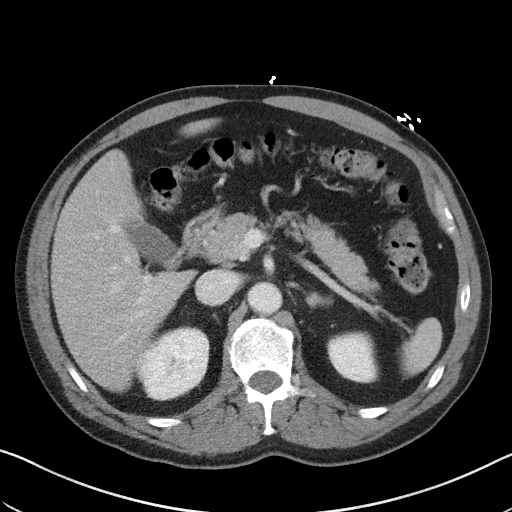
[im 62/86  bone]
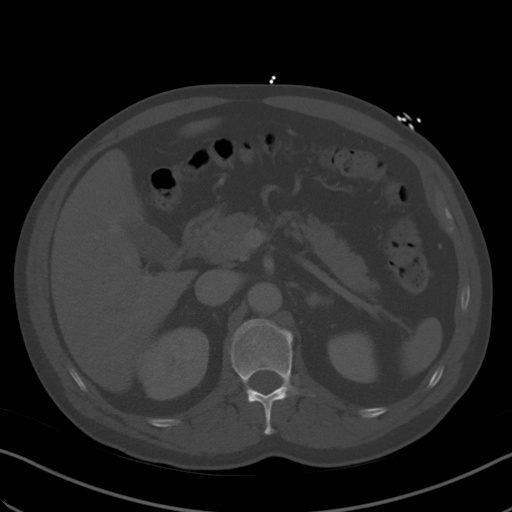
[im 67/86  soft-tissue]
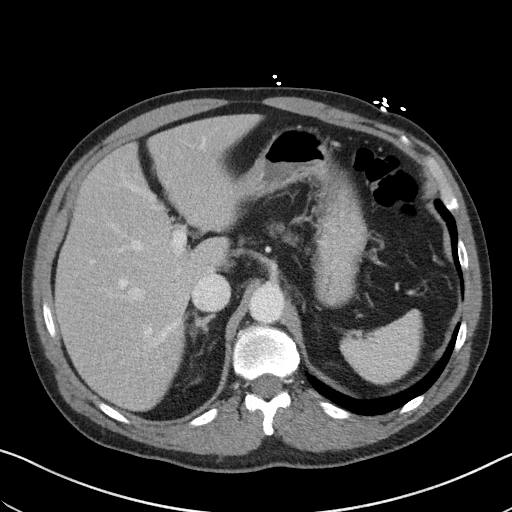
[im 71/86  soft-tissue]
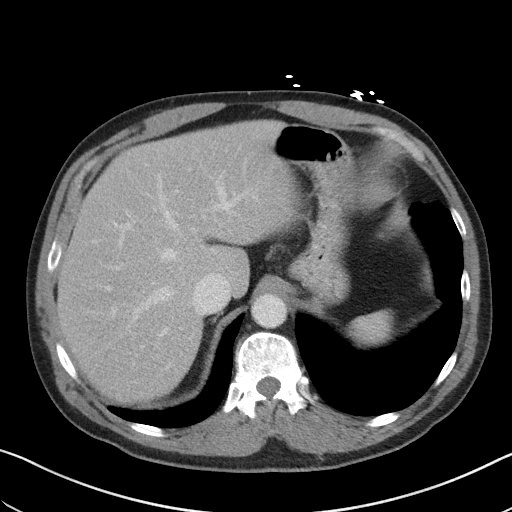
[im 81/86  soft-tissue]
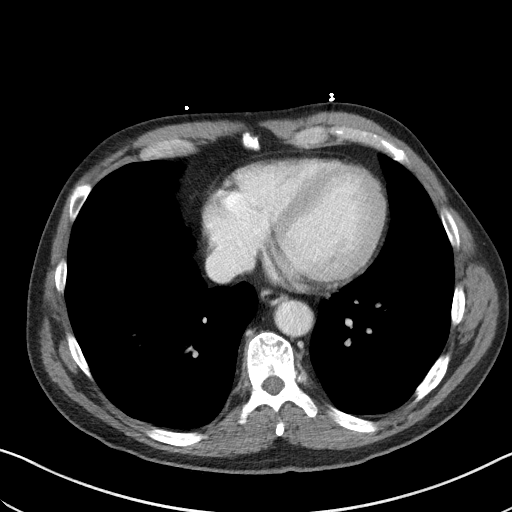

[Series 5: coronal st · coronal · 0.70mm/px · 3 of 93 slices shown]
[im 31/93  soft-tissue]
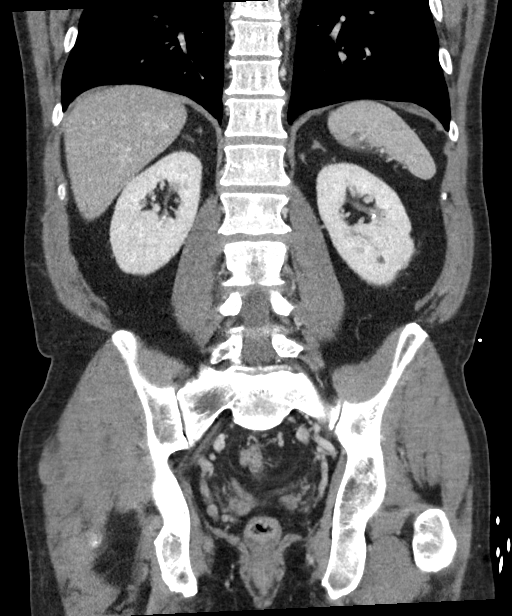
[im 41/93  soft-tissue]
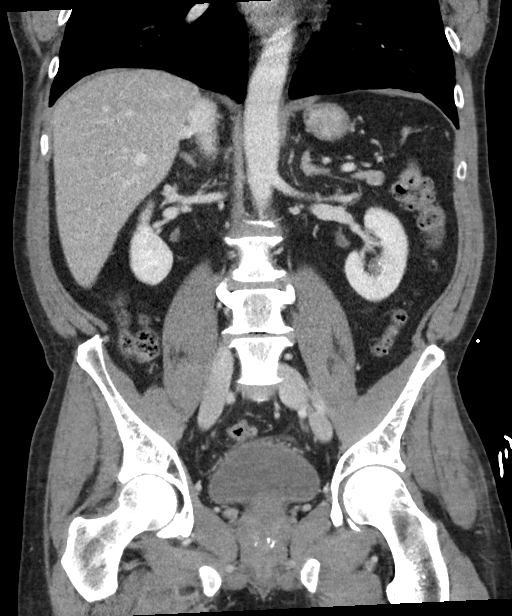
[im 52/93  soft-tissue]
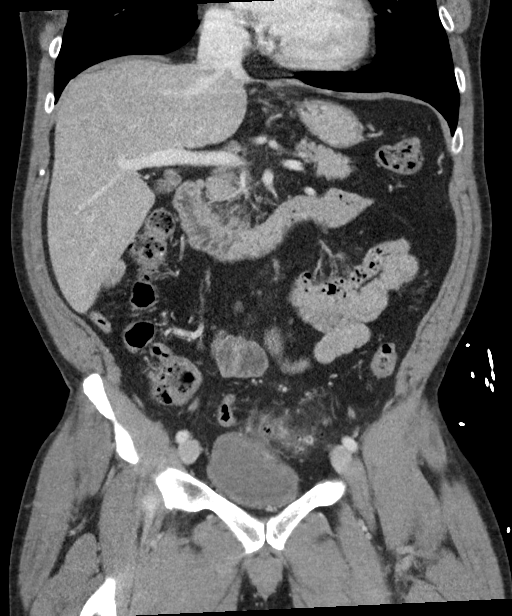

[15 of 46 positions shown; findings below may reference images not displayed]

FINDINGS: Lower chest: There is mild bibasilar atelectatic change.

Hepatobiliary: No focal liver lesions are evident. Gallbladder wall
is not appreciably thickened. There is no biliary duct dilatation.

Pancreas: No pancreatic mass or inflammatory focus.

Spleen: No splenic lesions are evident.

Adrenals/Urinary Tract: Adrenals bilaterally appear within normal
limits. There is a cyst in the medial mid left kidney measuring
x 0.6 cm. A cyst more posteriorly in the mid left kidney measures 8
x 7 mm. There is a cyst in the lateral lower pole left kidney
measuring 5 x 5 mm. There is no hydronephrosis on either side. There
is a staghorn type calculus in the lower pole of the left kidney
measuring 1.5 x 1.1 cm. There is a nearby 2 mm calculus in this
area. There is a calculus slightly more medially in the lower pole
left kidney region measuring 1.1 x 0.7 cm. There is a 2 mm calculus
in the lower pole the right kidney. There is no appreciable ureteral
calculus on either side. Urinary bladder is midline with slight
thickening of the urinary bladder wall.

Stomach/Bowel: There is inflammatory change in the mid sigmoid colon
region with wall thickening and several irregular diverticula. There
is mild loculated fluid in this area and mesenteric stranding. There
is no frank abscess or perforation in this area of mid sigmoid colon
diverticulitis. There is localized panniculitis in this area due to
inflammation from the diverticulitis. No inflammatory changes
involving bowel noted elsewhere. No evident bowel obstruction. No
abscess in the abdomen or pelvis.

Vascular/Lymphatic: There is aorto biiliac atherosclerosis. No
aneurysm evident. Major mesenteric arterial vessels are patent.
There is no adenopathy in the abdomen or pelvis.

Reproductive: There are prostatic calculi. Prostate and seminal
vesicles are normal in size and contour. There is no pelvic mass.

Other: There is no demonstrable appendiceal inflammation. No frank
abscess noted in the abdomen or pelvis. No ascites evident in the
abdomen or pelvis.

Musculoskeletal: There are no blastic or lytic bone lesions. There
is no intramuscular or abdominal wall lesions evident.
IMPRESSION: 1. Mid sigmoid colonic diverticulitis. There is moderate wall
thickening and mesenteric inflammatory change in this area. No
perforation or abscess noted.

2. Mild urinary bladder wall thickening which may be in part
secondary to nearby diverticulitis. Correlation with urinalysis to
assess for potential cystitis advised.

3. Multiple renal calculi on the left including staghorn type
calculus in lower pole region. Small calculus lower pole right
kidney. No hydronephrosis or ureteral calculus on either side.
Several prostatic calculi noted.

4.  Aortoiliac atherosclerosis.  No aneurysm evident.

Aortic Atherosclerosis ([id number].T).
